# Patient Record
Sex: Female | Born: 1990 | Hispanic: Yes | Marital: Single | Smoking: Never smoker
Health system: Southern US, Community
[De-identification: ages and names within clinical notes are randomized; demographics above are authoritative.]

## PROBLEM LIST (undated history)

## (undated) ENCOUNTER — Inpatient Hospital Stay (HOSPITAL_COMMUNITY): Payer: Self-pay

## (undated) DIAGNOSIS — I10 Essential (primary) hypertension: Secondary | ICD-10-CM

## (undated) DIAGNOSIS — N390 Urinary tract infection, site not specified: Secondary | ICD-10-CM

## (undated) HISTORY — PX: NO PAST SURGERIES: SHX2092

## (undated) HISTORY — PX: DENTAL SURGERY: SHX609

---
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
## 2014-07-09 NOTE — L&D Delivery Note (Signed)
Delivery Note At 11:51 PM a viable female was delivered via Vaginal, Spontaneous Delivery (Presentation: Left Occiput Anterior).  APGAR: 8, 9; weight: 2614 grams .   Placenta status: Intact, Spontaneous.  Cord: 3 vessels with the following complications: None.  Cord pH: none  Anesthesia: None  Episiotomy: None Lacerations: None Suture Repair: none Est. Blood Loss (mL): 600  Mom to postpartum.  Baby to Couplet care / Skin to Skin.  Sukari Grist A 04/30/2015, 12:31 AM

## 2014-11-06 ENCOUNTER — Emergency Department (HOSPITAL_COMMUNITY)
Admission: EM | Admit: 2014-11-06 | Discharge: 2014-11-06 | Disposition: A | Discharge status: Home / Self Care | Payer: Medicaid Other | Attending: Emergency Medicine | Admitting: Emergency Medicine

## 2014-11-06 ENCOUNTER — Encounter (HOSPITAL_COMMUNITY): Payer: Self-pay | Admitting: Nurse Practitioner

## 2014-11-06 ENCOUNTER — Emergency Department (HOSPITAL_COMMUNITY): Payer: Medicaid Other

## 2014-11-06 DIAGNOSIS — O26899 Other specified pregnancy related conditions, unspecified trimester: Secondary | ICD-10-CM

## 2014-11-06 DIAGNOSIS — O9989 Other specified diseases and conditions complicating pregnancy, childbirth and the puerperium: Secondary | ICD-10-CM | POA: Diagnosis present

## 2014-11-06 DIAGNOSIS — R110 Nausea: Secondary | ICD-10-CM | POA: Diagnosis not present

## 2014-11-06 DIAGNOSIS — R1031 Right lower quadrant pain: Secondary | ICD-10-CM | POA: Diagnosis not present

## 2014-11-06 DIAGNOSIS — Z3A12 12 weeks gestation of pregnancy: Secondary | ICD-10-CM | POA: Insufficient documentation

## 2014-11-06 DIAGNOSIS — R109 Unspecified abdominal pain: Secondary | ICD-10-CM

## 2014-11-06 LAB — ABO/RH: ABO/RH(D): O POS

## 2014-11-06 LAB — TYPE AND SCREEN
ABO/RH(D): O POS
Antibody Screen: NEGATIVE

## 2014-11-06 MED ORDER — ACETAMINOPHEN 325 MG PO TABS: 650.0000 mg | ORAL_TABLET | Freq: Once | ORAL | Status: DC

## 2014-11-06 MED ORDER — ACETAMINOPHEN 325 MG PO TABS
650.0000 mg | ORAL_TABLET | Freq: Once | ORAL | Status: AC
  Administered 2014-11-06: 650 mg via ORAL
  Filled 2014-11-06: qty 2

## 2014-11-06 NOTE — ED Notes (Signed)
Pt. At US

## 2014-11-06 NOTE — Discharge Instructions (Signed)
Abdominal Pain During Pregnancy Abdominal pain is common in pregnancy. Most of the time, it does not cause harm. There are many causes of abdominal pain. Some causes are more serious than others. Some of the causes of abdominal pain in pregnancy are easily diagnosed. Occasionally, the diagnosis takes time to understand. Other times, the cause is not determined. Abdominal pain can be a sign that something is very wrong with the pregnancy, or the pain may have nothing to do with the pregnancy at all. For this reason, always tell your health care provider if you have any abdominal discomfort. HOME CARE INSTRUCTIONS  Monitor your abdominal pain for any changes. The following actions may help to alleviate any discomfort you are experiencing:  Do not have sexual intercourse or put anything in your vagina until your symptoms go away completely.  Get plenty of rest until your pain improves.  Drink clear fluids if you feel nauseous. Avoid solid food as long as you are uncomfortable or nauseous.  Only take over-the-counter or prescription medicine as directed by your health care provider.  Keep all follow-up appointments with your health care provider. SEEK IMMEDIATE MEDICAL CARE IF:  You are bleeding, leaking fluid, or passing tissue from the vagina.  You have increasing pain or cramping.  You have persistent vomiting.  You have painful or bloody urination.  You have a fever.  You notice a decrease in your baby's movements.  You have extreme weakness or feel faint.  You have shortness of breath, with or without abdominal pain.  You develop a severe headache with abdominal pain.  You have abnormal vaginal discharge with abdominal pain.  You have persistent diarrhea.  You have abdominal pain that continues even after rest, or gets worse. MAKE SURE YOU:   Understand these instructions.  Will watch your condition.  Will get help right away if you are not doing well or get  worse. Document Released: 06/25/2005 Document Revised: 04/15/2013 Document Reviewed: 01/22/2013 [facility] Patient Information 2015 [employer] [place] [place]. This information is not intended to replace advice given to you by your health care provider. Make sure you discuss any questions you have with your health care provider.  You were evaluated in the ED today for your abdominal pain. There does not appear to be an emergent cause for your symptoms at this time. It is important. Follow-up with your OB/GYN next week for your regularly scheduled appointment. He may take Tylenol for any abdominal discomfort to experience. Return to ED for new or worsening symptoms

## 2014-11-06 NOTE — ED Notes (Signed)
Patient reports playing with son who jumped on abdomen since then patient has been having lower abdominal pain. Patient sts pain is intermittent dull pain throughout lower abdominal quadrants. Patient endorses baseline nausea with first trimester, denies vomiting, diarrhea or constipation. Patient denies vaginal bleeding or discharge. Lower abdomen is tender to palpation, no bruising or abrasions noted.

## 2014-11-06 NOTE — ED Provider Notes (Signed)
CSN: [id number]     Arrival date & time 11/06/14  0909 History   First MD Initiated Contact with Patient 11/06/14 [phone]     Chief Complaint  Patient presents with  . Abdominal Pain     (Consider location/radiation/quality/duration/timing/severity/associated sxs/prior Treatment) HPI Stephanie [name] is a 24 y.o. female G4 P1203 who comes in for evaluation of lower abdominal discomfort. Patient states she was told by the health department when her due date was reports that she is approximately [redacted] weeks pregnant. [facility]. She reports this morning at approximately 7:30 AM, her youngest son jumped on her abdomen which resulted in immediate cramping-type pain rated as a 6/10. She has not tried anything to improve the symptoms. Nothing makes it better or worse. Denies any vaginal bleeding or discharge, urinary symptoms. She reports nausea is baseline for her, denies any vomiting. No other aggravating or modifying factors.  History reviewed. No pertinent past medical history. History reviewed. No pertinent past surgical history. History reviewed. No pertinent family history. History  Substance Use Topics  . Smoking status: Never Smoker   . Smokeless tobacco: Not on file  . Alcohol Use: No   OB History    Gravida Para Term Preterm AB TAB SAB Ectopic Multiple Living   4 3 2 1      3      Review of Systems A 10 point review of systems was completed and was negative except for pertinent positives and negatives as mentioned in the history of present illness     Allergies  Review of patient's allergies indicates no known allergies.  Home Medications   Prior to Admission medications   Medication Sig Start Date End Date Taking? Authorizing Provider  Prenatal Multivit-Min-Fe-FA (PRENATAL VITAMINS PO) Take by mouth. 08/24/14 05/23/15 Yes Historical Provider, MD  acetaminophen (TYLENOL) 325 MG tablet Take 2 tablets (650 mg total) by mouth once.  11/06/14   Joycie PeekBenjamin Ayvin Lipinski, PA-C   BP 119/62 mmHg  Pulse 98  Temp(Src) 98.1 F (36.7 C) (Oral)  Resp 16  Ht 5\' 3"  (1.6 m)  Wt 155 lb (70.308 kg)  BMI 27.46 kg/m2  SpO2 99%  LMP 08/14/2014 (Exact Date) Physical Exam  Constitutional: She is oriented to person, place, and time. She appears well-developed and well-nourished.  HENT:  Head: Normocephalic and atraumatic.  Mouth/Throat: Oropharynx is clear and moist.  Eyes: Conjunctivae are normal. Pupils are equal, round, and reactive to light. Right eye exhibits no discharge. Left eye exhibits no discharge. No scleral icterus.  Neck: Neck supple.  Cardiovascular: Normal rate, regular rhythm and normal heart sounds.   Pulmonary/Chest: Effort normal and breath sounds normal. No respiratory distress. She has no wheezes. She has no rales.  Abdominal: Soft. There is no tenderness.  Discomfort with palpation to right lower abdomen/pelvic region and over midline of the pubic region. No ecchymosis, lesions or other deformities noted.  Genitourinary:  Chaperone was present for the entire genital exam. No lesions or rashes appreciated on vulva. No blood appreciated in vaginal vault. Os closed  Musculoskeletal: She exhibits no tenderness.  Neurological: She is alert and oriented to person, place, and time.  Cranial Nerves II-XII grossly intact  Skin: Skin is warm and dry. No rash noted.  Psychiatric: She has a normal mood and affect.  Nursing note and vitals reviewed.   ED Course  Procedures (including critical care time) Labs Review Labs Reviewed  TYPE AND SCREEN  ABO/RH  Imaging Review US Ob Limited  11/06/2014   CLINICAL DATA:  Lower abdominal pain post trauma. By LMP the patient is 12 weeks 0 days. EDC by LMP is 05/21/2015.  EXAM: OBSTETRIC <14 WK ULTRASOUND  TECHNIQUE: Transabdominal ultrasound was performed for evaluation of the gestation as well as the maternal uterus and adnexal regions.  COMPARISON:  None.  FINDINGS:  Intrauterine gestational sac: Present  Yolk sac:  Probably present.  See below.  Embryo:  Present  Cardiac Activity: Present  Heart Rate: 150 bpm  CRL:   49.1  mm   11 w 5 d                  [place] EDC: 05/25/2015  Maternal uterus/adnexae: Adjacent to the fetal head there is a cystic structure measuring 7 mm, likely representing a normal yolk sac. However, followup is recommended to document resolution of this structure. No subchorionic hemorrhage. The ovaries have a normal appearance. Small right ovarian cyst is 1.3 cm, likely a corpus luteum. No free pelvic fluid.  IMPRESSION: 1. Single living intrauterine embryo measuring 11 weeks 5 days and confirming clinical EDC of 05/21/2015. 2. Probable yolk sac adjacent to the fetal head. Followup is recommended to document progression of the structure to confirm that represents yolk sac.   Electronically Signed   By: Norva Pavlov M.D.   On: 11/06/2014 11:35     EKG Interpretation None     Meds given in ED:  Medications  acetaminophen (TYLENOL) tablet 650 mg (650 mg Oral Given 11/06/14 1200)    Discharge Medication List as of 11/06/2014 12:11 PM    START taking these medications   Details  acetaminophen (TYLENOL) 325 MG tablet Take 2 tablets (650 mg total) by mouth once., Starting 11/06/2014, Print       Filed Vitals:   11/06/14 0925 11/06/14 0930 11/06/14 1000 11/06/14 1148  BP: 129/86 125/94 120/78 119/62  Pulse: 87 89 82 98  Temp: 98.1 F (36.7 C)     TempSrc: Oral     Resp: 16   16  Height:  (1.6 m)     Weight: 155 lb (70.308 kg)     SpO2: 98% 98% 99% 99%    MDM  Vitals stable - WNL -afebrile Pt resting comfortably in ED. PE--mild discomfort in the lower abdomen. Normal pelvic exam, no blood in vault, os closed. Otherwise normal physical exam. Labwork: Patient type and screen O+ Imaging: US OB limited shows single living intrauterine embryo measuring 11 weeks 5 days  DDX--patient feels better after administration of Tylenol.  Evaluation completed in the ED is reassuring and patient will be discharged to follow-up with her OB/GYN for regularly scheduled appointment next week. No evidence of other acute or emergent pathology at this time.  I discussed all relevant lab findings and imaging results with pt and they verbalized understanding. Discussed f/u with PCP within 48 hrs and return precautions, pt very amenable to plan. Prior to patient discharge, I discussed and reviewed this case with Dr. Rubin Payor    Final diagnoses:  Abdominal pain during pregnancy       Joycie Peek, PA-C 11/06/14 1628  Benjiman Core, MD 11/07/14 [phone]

## 2014-11-12 ENCOUNTER — Encounter: Payer: Self-pay | Admitting: Certified Nurse Midwife

## 2014-11-12 ENCOUNTER — Ambulatory Visit (INDEPENDENT_AMBULATORY_CARE_PROVIDER_SITE_OTHER): Payer: Medicaid Other | Admitting: Certified Nurse Midwife

## 2014-11-12 VITALS — BP 128/81 | HR 94 | Temp 97.8°F | Wt 155.0 lb

## 2014-11-12 DIAGNOSIS — O2690 Pregnancy related conditions, unspecified, unspecified trimester: Secondary | ICD-10-CM | POA: Diagnosis not present

## 2014-11-12 DIAGNOSIS — O0991 Supervision of high risk pregnancy, unspecified, first trimester: Secondary | ICD-10-CM

## 2014-11-12 DIAGNOSIS — O09213 Supervision of pregnancy with history of pre-term labor, third trimester: Secondary | ICD-10-CM | POA: Insufficient documentation

## 2014-11-12 DIAGNOSIS — O2341 Unspecified infection of urinary tract in pregnancy, first trimester: Secondary | ICD-10-CM

## 2014-11-12 MED ORDER — DOXYLAMINE-PYRIDOXINE 10-10 MG PO TBEC: 1.0000 | DELAYED_RELEASE_TABLET | Freq: Three times a day (TID) | ORAL | Status: DC

## 2014-11-12 MED ORDER — PRENATE 0.6-0.4 MG PO CHEW: 1.0000 | CHEWABLE_TABLET | Freq: Every day | ORAL | Status: DC

## 2014-11-12 MED ORDER — NITROFURANTOIN MONOHYD MACRO 100 MG PO CAPS: 100.0000 mg | ORAL_CAPSULE | Freq: Two times a day (BID) | ORAL | Status: AC

## 2014-11-12 NOTE — Addendum Note (Signed)
Addended by: Henriette CombsHATTON, Kyson Kupper L on: 11/12/2014 04:31 PM   Modules accepted: Orders

## 2014-11-12 NOTE — Addendum Note (Signed)
Addended by: Henriette CombsHATTON, Draiden Mirsky L on: 11/12/2014 04:23 PM   Modules accepted: Orders

## 2014-11-12 NOTE — Progress Notes (Signed)
Subjective:    Stephanie [name] is being seen today for her first obstetrical visit.  This is not a planned pregnancy. She is at 4066w5d gestation. Her obstetrical history is significant for pre-eclampsia with first pregnancy and IOL for preeclampsia with first pregnancy. 3rd pregnancy was preterm at 32 weeks, with premature cervical dilation starting at 28 weeks. Relationship with FOB: spouse, living together. Patient does intend to breast feed. Pregnancy history fully reviewed.  The information documented in the HPI was reviewed and verified.  Menstrual History: OB History    Gravida Para Term Preterm AB TAB SAB Ectopic Multiple Living   4 3 2 1  0 0 0 0 0 3      Menarche age: [age]  Patient's last menstrual period was 08/14/2014 (exact date).    Past Medical History  Diagnosis Date  . Medical history non-contributory     Past Surgical History  Procedure Laterality Date  . No past surgeries       (Not in a hospital admission) No Known Allergies  History  Substance Use Topics  . Smoking status: Never Smoker   . Smokeless tobacco: Never Used  . Alcohol Use: No    Family History  Problem Relation Age of Onset  . Diabetes Father   . Hypertension Father   . Hyperlipidemia Father      Review of Systems Constitutional: negative for weight loss Gastrointestinal: + for nausea & vomiting, GERD Genitourinary:negative for genital lesions and vaginal discharge and dysuria Musculoskeletal:negative for back pain Behavioral/Psych: negative for abusive relationship, depression, illegal drug usage and tobacco use    Objective:    BP 128/81 mmHg  Pulse 94  Temp(Src) 97.8 F (36.6 C)  Wt 70.308 kg (155 lb)  LMP 08/14/2014 (Exact Date) General Appearance:    Alert, cooperative, no distress, appears stated age  Head:    Normocephalic, without obvious abnormality, atraumatic  Eyes:    PERRL, conjunctiva/corneas clear, EOM's intact, fundi    benign, both eyes  Ears:    Normal TM's and  external ear canals, both ears  Nose:   Nares normal, septum midline, mucosa normal, no drainage    or sinus tenderness  Throat:   Lips, mucosa, and tongue normal; teeth and gums normal  Neck:   Supple, symmetrical, trachea midline, no adenopathy;    thyroid:  no enlargement/tenderness/nodules; no carotid   bruit or JVD  Back:     Symmetric, no curvature, ROM normal, no CVA tenderness  Lungs:     Clear to auscultation bilaterally, respirations unlabored  Chest Wall:    No tenderness or deformity   Heart:    Regular rate and rhythm, S1 and S2 normal, no murmur, rub   or gallop  Breast Exam:    No tenderness, masses, or nipple abnormality  Abdomen:     Soft, non-tender, bowel sounds active all four quadrants,    no masses, no organomegaly  Genitalia:    Normal female without lesion, discharge or tenderness  Extremities:   Extremities normal, atraumatic, no cyanosis or edema  Pulses:   2+ and symmetric all extremities  Skin:   Skin color, texture, turgor normal, no rashes or lesions  Lymph nodes:   Cervical, supraclavicular, and axillary nodes normal  Neurologic:   CNII-XII intact, normal strength, sensation and reflexes    throughout      Lab Review Urine pregnancy test Labs reviewed yes Radiologic studies reviewed yes Assessment:    Pregnancy at 166w5d weeks   ?17-P UTI  Plan:   Prenatal vitamins.  Counseling provided regarding continued use of seat belts, cessation of alcohol consumption, smoking or use of illicit drugs; infection precautions i.e., influenza/TDAP immunizations, toxoplasmosis,CMV, parvovirus, listeria and varicella; workplace safety, exercise during pregnancy; routine dental care, safe medications, sexual activity, hot tubs, saunas, pools, travel, caffeine use, fish and methlymercury, potential toxins, hair treatments, varicose veins Weight gain recommendations per IOM guidelines reviewed: underweight/BMI< 18.5--> gain 28 - 40 lbs; normal weight/BMI 18.5 - 24.9-->  gain 25 - 35 lbs; overweight/BMI 25 - 29.9--> gain 15 - 25 lbs; obese/BMI >30->gain  11 - 20 lbs Problem list reviewed and updated. FIRST/CF mutation testing/NIPT/QUAD SCREEN/fragile X/Ashkenazi Jewish population testing/Spinal muscular atrophy discussed: undecided. Role of ultrasound in pregnancy discussed; fetal survey: requested. Amniocentesis discussed: not indicated.   Meds ordered this encounter  Medications  . Prenat MV-Min-Methylfolate-FA (PRENATE) 0.6-0.4 MG CHEW    Sig: Chew 1 tablet by mouth daily.    Dispense:  30 tablet    Refill:  6  . Doxylamine-Pyridoxine (DICLEGIS) 10-10 MG TBEC    Sig: Take 1 tablet by mouth 3 (three) times daily.    Dispense:  100 tablet    Refill:  3  . nitrofurantoin, macrocrystal-monohydrate, (MACROBID) 100 MG capsule    Sig: Take 1 capsule (100 mg total) by mouth 2 (two) times daily.    Dispense:  14 capsule    Refill:  0   Orders Placed This Encounter  Procedures  . Culture, OB Urine    Standing Status: Future     Number of Occurrences:      Standing Expiration Date: 11/12/2015  . Obstetric panel    Standing Status: Standing     Number of Occurrences: 1     Standing Expiration Date: 11/12/2015  . HIV antibody    Standing Status: Future     Number of Occurrences:      Standing Expiration Date: 11/12/2015  . Hemoglobinopathy evaluation    Standing Status: Future     Number of Occurrences:      Standing Expiration Date: 11/12/2015  . Varicella zoster antibody, IgG    Standing Status: Standing     Number of Occurrences: 1     Standing Expiration Date: 11/12/2015  . Vit D  25 hydroxy (rtn osteoporosis monitoring)    Standing Status: Future     Number of Occurrences:      Standing Expiration Date: 11/12/2015  . TSH    Standing Status: Future     Number of Occurrences:      Standing Expiration Date: 11/12/2015  . AMB referral to maternal fetal medicine    Referral Priority:  Routine    Referral Type:  Consultation    Number of Visits  Requested:  1    Follow up in 4 weeks. 50% of 30 min visit spent on counseling and coordination of care.

## 2014-11-15 LAB — PAP IG W/ RFLX HPV ASCU

## 2014-11-15 LAB — CULTURE, OB URINE

## 2014-11-16 ENCOUNTER — Other Ambulatory Visit: Payer: Medicaid Other

## 2014-11-16 DIAGNOSIS — Z3482 Encounter for supervision of other normal pregnancy, second trimester: Secondary | ICD-10-CM

## 2014-11-16 LAB — SURESWAB, VAGINOSIS/VAGINITIS PLUS
Atopobium vaginae: NOT DETECTED Log (cells/mL)
C. ALBICANS, DNA: NOT DETECTED
C. GLABRATA, DNA: NOT DETECTED
C. PARAPSILOSIS, DNA: NOT DETECTED
C. TRACHOMATIS RNA, TMA: NOT DETECTED
C. TROPICALIS, DNA: NOT DETECTED
Gardnerella vaginalis: NOT DETECTED Log (cells/mL)
LACTOBACILLUS SPECIES: 7.4 Log (cells/mL)
MEGASPHAERA SPECIES: NOT DETECTED Log (cells/mL)
N. gonorrhoeae RNA, TMA: NOT DETECTED
T. VAGINALIS RNA, QL TMA: NOT DETECTED

## 2014-11-16 LAB — TSH: TSH: 1.427 u[IU]/mL (ref 0.350–4.500)

## 2014-11-17 LAB — OBSTETRIC PANEL
Antibody Screen: NEGATIVE
BASOS PCT: 0 % (ref 0–1)
Basophils Absolute: 0 10*3/uL (ref 0.0–0.1)
Eosinophils Absolute: 0.1 10*3/uL (ref 0.0–0.7)
Eosinophils Relative: 1 % (ref 0–5)
HEMATOCRIT: 37.3 % (ref 36.0–46.0)
HEMOGLOBIN: 12.6 g/dL (ref 12.0–15.0)
HEP B S AG: NEGATIVE
LYMPHS ABS: 1.7 10*3/uL (ref 0.7–4.0)
Lymphocytes Relative: 22 % (ref 12–46)
MCH: 29.9 pg (ref 26.0–34.0)
MCHC: 33.8 g/dL (ref 30.0–36.0)
MCV: 88.4 fL (ref 78.0–100.0)
MONOS PCT: 8 % (ref 3–12)
MPV: 10 fL (ref 8.6–12.4)
Monocytes Absolute: 0.6 10*3/uL (ref 0.1–1.0)
NEUTROS ABS: 5.3 10*3/uL (ref 1.7–7.7)
NEUTROS PCT: 69 % (ref 43–77)
Platelets: 212 10*3/uL (ref 150–400)
RBC: 4.22 MIL/uL (ref 3.87–5.11)
RDW: 14.9 % (ref 11.5–15.5)
Rh Type: POSITIVE
Rubella: 1.02 Index — ABNORMAL HIGH (ref <0.90)
WBC: 7.7 10*3/uL (ref 4.0–10.5)

## 2014-11-17 LAB — VITAMIN D 25 HYDROXY (VIT D DEFICIENCY, FRACTURES): VIT D 25 HYDROXY: 13 ng/mL — AB (ref 30–100)

## 2014-11-17 LAB — VARICELLA ZOSTER ANTIBODY, IGG: VARICELLA IGG: 294.9 {index} — AB (ref <135.00)

## 2014-11-17 LAB — HIV ANTIBODY (ROUTINE TESTING W REFLEX): HIV 1&2 Ab, 4th Generation: NONREACTIVE

## 2014-11-18 ENCOUNTER — Telehealth: Payer: Self-pay | Admitting: *Deleted

## 2014-11-18 LAB — HEMOGLOBINOPATHY EVALUATION
HEMOGLOBIN OTHER: 0 %
HGB A2 QUANT: 2.7 % (ref 2.2–3.2)
HGB F QUANT: 0.9 % (ref 0.0–2.0)
Hgb A: 96.4 % — ABNORMAL LOW (ref 96.8–97.8)
Hgb S Quant: 0 %

## 2014-11-18 NOTE — Telephone Encounter (Signed)
Call received from [facility] in [place] regarding pt Diclegis Rx.  Pharmacy is inquiring about Prior Approval.  Call placed to pharmacy.  LM making them aware that a PA has been sent to insurance and may take 24-48 hours for approval.  Advised to contact office if any other problems.

## 2014-11-19 ENCOUNTER — Other Ambulatory Visit (HOSPITAL_COMMUNITY): Payer: Self-pay | Admitting: Maternal and Fetal Medicine

## 2014-11-19 ENCOUNTER — Ambulatory Visit (HOSPITAL_COMMUNITY)
Admission: RE | Admit: 2014-11-19 | Discharge: 2014-11-19 | Disposition: A | Discharge status: Home / Self Care | Payer: Medicaid Other | Source: Ambulatory Visit | Attending: Certified Nurse Midwife | Admitting: Certified Nurse Midwife

## 2014-11-19 ENCOUNTER — Encounter (HOSPITAL_COMMUNITY): Payer: Self-pay

## 2014-11-19 DIAGNOSIS — Z3A13 13 weeks gestation of pregnancy: Secondary | ICD-10-CM | POA: Diagnosis not present

## 2014-11-19 DIAGNOSIS — O09899 Supervision of other high risk pregnancies, unspecified trimester: Secondary | ICD-10-CM

## 2014-11-19 DIAGNOSIS — O09219 Supervision of pregnancy with history of pre-term labor, unspecified trimester: Principal | ICD-10-CM

## 2014-11-19 DIAGNOSIS — O09211 Supervision of pregnancy with history of pre-term labor, first trimester: Secondary | ICD-10-CM | POA: Insufficient documentation

## 2014-11-19 NOTE — ED Notes (Signed)
Patient states she does feel cramping occasionally.

## 2014-11-19 NOTE — Consult Note (Signed)
Maternal Fetal Medicine Consultation  Requesting Provider(s): [name], CNM  Reason for consultation: Hx of previous preterm delivery at 32 weeks  HPI: Stephanie [name] is a 24 yo [id number], EDD 05/21/2015 who is currently at 13w 6d seen for consultation due to a history of preterm delivery at 32 weeks.  Her past OB history is a follows:  2010: delivery at 37 weeks - induced due to gestational hypertension vs. Preeclampsia 2012: Term SVD without complications 2013: spontaneous preterm delivery at 32 weeks; developed some symptoms at 27 weeks  The patient's prenatal course has otherwise been uncomplicated.  She is without complaints today.  OB History: OB History    Gravida Para Term Preterm AB TAB SAB Ectopic Multiple Living   4 3 2 1  0 0 0 0 0 3      PMH:  Past Medical History  Diagnosis Date  . Medical history non-contributory     PSH:  Past Surgical History  Procedure Laterality Date  . No past surgeries     Meds:  Current Outpatient Prescriptions on File Prior to Encounter  Medication Sig Dispense Refill  . acetaminophen (TYLENOL) 325 MG tablet Take 2 tablets (650 mg total) by mouth once. 20 tablet 0  . Doxylamine-Pyridoxine (DICLEGIS) 10-10 MG TBEC Take 1 tablet by mouth 3 (three) times daily. 100 tablet 3  . nitrofurantoin, macrocrystal-monohydrate, (MACROBID) 100 MG capsule Take 1 capsule (100 mg total) by mouth 2 (two) times daily. 14 capsule 0  . Prenatal Multivit-Min-Fe-FA (PRENATAL VITAMINS PO) Take by mouth.    Burnis Medin. Prenat MV-Min-Methylfolate-FA (PRENATE) 0.6-0.4 MG CHEW Chew 1 tablet by mouth daily. 30 tablet 6   No current facility-administered medications on file prior to encounter.   Allergies: No Known Allergies  FH:  Family History  Problem Relation Age of Onset  . Diabetes Father   . Hypertension Father   . Hyperlipidemia Father     Soc:  History   Social History  . Marital Status: Single    Spouse Name: N/A  . Number of Children: N/A  .  Years of Education: N/A   Occupational History  . Not on file.   Social History Main Topics  . Smoking status: Never Smoker   . Smokeless tobacco: Never Used  . Alcohol Use: No  . Drug Use: No  . Sexual Activity: Yes    Birth Control/ Protection: None   Other Topics Concern  . Not on file   Social History Narrative    Review of Systems: no vaginal bleeding or cramping/contractions, no LOF, no nausea/vomiting. All other systems reviewed and are negative.  PE:   Filed Vitals:   11/19/14 1023  BP: 120/76  Pulse: 76   A/P: 1) Single IUP at 13w 6d  2) Hx of previous preterm delivery - recommend beginning 17-P injections at 16 to [redacted] weeks gestation.  Patient will follow up with us at ~ 18 weeks for her anatomy ultrasound.  We will begin cervical length surveillance at that time - with cervical length ultrasounds every 2 weeks at least until [redacted] weeks gestation.  Thank you for the opportunity to be a part of the care of Stephanie [name]. Please contact our office if we can be of further assistance.   I spent approximately 30 minutes with this patient with over 50% of time spent in face-to-face counseling.  Alpha GulaPaul Laurelin Elson, MD Maternal Fetal Medicine

## 2014-11-23 ENCOUNTER — Encounter: Payer: Self-pay | Admitting: Certified Nurse Midwife

## 2014-11-24 ENCOUNTER — Encounter (HOSPITAL_COMMUNITY): Payer: Self-pay | Admitting: *Deleted

## 2014-11-24 ENCOUNTER — Inpatient Hospital Stay (HOSPITAL_COMMUNITY)
Admission: AD | Admit: 2014-11-24 | Discharge: 2014-11-24 | Disposition: A | Discharge status: Home / Self Care | Payer: Medicaid Other | Source: Ambulatory Visit | Attending: Obstetrics | Admitting: Obstetrics

## 2014-11-24 DIAGNOSIS — Y92013 Bedroom of single-family (private) house as the place of occurrence of the external cause: Secondary | ICD-10-CM | POA: Diagnosis not present

## 2014-11-24 DIAGNOSIS — Z3A14 14 weeks gestation of pregnancy: Secondary | ICD-10-CM | POA: Diagnosis not present

## 2014-11-24 DIAGNOSIS — M549 Dorsalgia, unspecified: Secondary | ICD-10-CM | POA: Diagnosis present

## 2014-11-24 DIAGNOSIS — O26899 Other specified pregnancy related conditions, unspecified trimester: Secondary | ICD-10-CM

## 2014-11-24 DIAGNOSIS — W010XXA Fall on same level from slipping, tripping and stumbling without subsequent striking against object, initial encounter: Secondary | ICD-10-CM | POA: Insufficient documentation

## 2014-11-24 DIAGNOSIS — O9989 Other specified diseases and conditions complicating pregnancy, childbirth and the puerperium: Secondary | ICD-10-CM | POA: Insufficient documentation

## 2014-11-24 DIAGNOSIS — R109 Unspecified abdominal pain: Secondary | ICD-10-CM | POA: Diagnosis not present

## 2014-11-24 DIAGNOSIS — O9A212 Injury, poisoning and certain other consequences of external causes complicating pregnancy, second trimester: Secondary | ICD-10-CM

## 2014-11-24 LAB — URINALYSIS, ROUTINE W REFLEX MICROSCOPIC
Bilirubin Urine: NEGATIVE
Glucose, UA: NEGATIVE mg/dL
Hgb urine dipstick: NEGATIVE
Ketones, ur: 15 mg/dL — AB
LEUKOCYTES UA: NEGATIVE
NITRITE: NEGATIVE
PH: 5.5 (ref 5.0–8.0)
PROTEIN: NEGATIVE mg/dL
Urobilinogen, UA: 0.2 mg/dL (ref 0.0–1.0)

## 2014-11-24 MED ORDER — ACETAMINOPHEN 500 MG PO TABS
1000.0000 mg | ORAL_TABLET | ORAL | Status: AC
  Administered 2014-11-24: 1000 mg via ORAL
  Filled 2014-11-24: qty 2

## 2014-11-24 MED ORDER — ACETAMINOPHEN 500 MG PO TABS: 500.0000 mg | ORAL_TABLET | ORAL | Status: DC

## 2014-11-24 NOTE — Discharge Instructions (Signed)

## 2014-11-24 NOTE — MAU Provider Note (Signed)
History     CSN: [id number]  Arrival date and time: 11/24/14 1551   First Provider Initiated Contact with Patient 11/24/14 1650      No chief complaint on file. CC: fall last evening hitting abdomen HPI Stephanie DoyneLaura [name] 24 y.o. [id number] @[redacted]w[redacted]d  presents to MAU complaining of abdominal painand back pain after a fall last night.  At 2am, she tripped over her dog on the way to the bathroom.  She fell onto her abdomen and caught herself somewhat with her lower arms.  She had pain upon rising from the fall but was able to right herself independently.  She has used Tylenol for this several times and it has been helpful.  Last Tylenol at 10am today and pain presently 6/10.  It is a dull crampy constant pain.  She denies vaginal bleeding, nausea, vomiting, fever weakness, dizziness.  She is not yet feeling movement from the baby.   OB History    Gravida Para Term Preterm AB TAB SAB Ectopic Multiple Living   4 3 2 1  0 0 0 0 0 3      Past Medical History  Diagnosis Date  . Medical history non-contributory     Past Surgical History  Procedure Laterality Date  . No past surgeries      Family History  Problem Relation Age of Onset  . Diabetes Father   . Hypertension Father   . Hyperlipidemia Father     History  Substance Use Topics  . Smoking status: Never Smoker   . Smokeless tobacco: Never Used  . Alcohol Use: No    Allergies: No Known Allergies  Prescriptions prior to admission  Medication Sig Dispense Refill Last Dose  . acetaminophen (TYLENOL) 325 MG tablet Take 2 tablets (650 mg total) by mouth once. (Patient taking differently: Take 650 mg by mouth every 4 (four) hours as needed for mild pain. ) 20 tablet 0 11/24/2014 at Unknown time  . calcium carbonate (TUMS - DOSED IN MG ELEMENTAL CALCIUM) 500 MG chewable tablet Chew 1 tablet by mouth daily as needed for indigestion or heartburn.   Past Week at Unknown time  . Doxylamine-Pyridoxine (DICLEGIS) 10-10 MG TBEC Take 1 tablet by  mouth 3 (three) times daily. 100 tablet 3 11/24/2014 at Unknown time  . nitrofurantoin, macrocrystal-monohydrate, (MACROBID) 100 MG capsule Take 100 mg by mouth 2 (two) times daily. Take for 10 days   11/23/2014 at Unknown time  . Prenat MV-Min-Methylfolate-FA (PRENATE) 0.6-0.4 MG CHEW Chew 1 tablet by mouth daily. 30 tablet 6 11/23/2014 at Unknown time    ROS Pertinent ROS in HPI.  All other systems are negative.   Physical Exam   Blood pressure 120/83, pulse 85, temperature 98.8 F (37.1 C), temperature source Oral, resp. rate 20, height 5' 2.25" (1.581 m), weight 152 lb (68.947 kg), last menstrual period 08/14/2014.  Physical Exam  Constitutional: She is oriented to person, place, and time. She appears well-developed and well-nourished. No distress.  HENT:  Head: Normocephalic and atraumatic.  Eyes: EOM are normal.  Neck: Normal range of motion.  Cardiovascular: Normal rate and regular rhythm.   Respiratory: Breath sounds normal. No respiratory distress.  GI: Soft. Bowel sounds are normal. She exhibits no distension. There is no tenderness.  Musculoskeletal: Normal range of motion.  Neurological: She is alert and oriented to person, place, and time.  Skin: Skin is warm and dry.  Psychiatric: She has a normal mood and affect.    MAU Course  Procedures  MDM  Good fetal heart tones on triage No vaginal bleeding  Normal vital signs  Assessment and Plan  A:  1. Abdominal pain in pregnancy   2. Traumatic injury during pregnancy in second trimester    P: Discharge to home If any vaginal bleeding/severe pain/etc, return to MAU right away F/u with Dr. Clearance CootsHarper [facility] Tylenol for pain management   Teague Edwena BlowClark, Karen E 11/24/2014, 4:50 PM

## 2014-11-24 NOTE — MAU Note (Signed)
Tripped over dog last night, landed on stomach. Started having pain in lower abd last night, now is also having low back pain.

## 2014-11-25 ENCOUNTER — Ambulatory Visit (INDEPENDENT_AMBULATORY_CARE_PROVIDER_SITE_OTHER): Payer: Medicaid Other

## 2014-11-25 ENCOUNTER — Other Ambulatory Visit: Payer: Self-pay | Admitting: Certified Nurse Midwife

## 2014-11-25 ENCOUNTER — Ambulatory Visit (INDEPENDENT_AMBULATORY_CARE_PROVIDER_SITE_OTHER): Payer: Medicaid Other | Admitting: Certified Nurse Midwife

## 2014-11-25 VITALS — BP 125/79 | HR 91 | Temp 98.3°F | Wt 155.0 lb

## 2014-11-25 DIAGNOSIS — O9A212 Injury, poisoning and certain other consequences of external causes complicating pregnancy, second trimester: Secondary | ICD-10-CM

## 2014-11-25 DIAGNOSIS — T1490 Injury, unspecified: Secondary | ICD-10-CM | POA: Diagnosis not present

## 2014-11-25 DIAGNOSIS — O0992 Supervision of high risk pregnancy, unspecified, second trimester: Secondary | ICD-10-CM | POA: Diagnosis not present

## 2014-11-25 LAB — US OB LIMITED

## 2014-11-25 LAB — POCT URINALYSIS DIPSTICK
BILIRUBIN UA: NEGATIVE
Blood, UA: NEGATIVE
Glucose, UA: NEGATIVE
LEUKOCYTES UA: NEGATIVE
NITRITE UA: NEGATIVE
Spec Grav, UA: 1.02
Urobilinogen, UA: NEGATIVE
pH, UA: 5
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
## 2014-12-01 LAB — AFP, QUAD SCREEN
AFP: 16 ng/mL
Age Alone: 1:1090 {titer}
Curr Gest Age: 14.5 wks.days
Down Syndrome Scr Risk Est: 1:3370 {titer}
HCG TOTAL: 39.7 [IU]/mL
INH: 93.9 pg/mL
Interpretation-AFP: NEGATIVE
MoM for AFP: 0.65
MoM for INH: 0.61
MoM for hCG: 0.9
Open Spina bifida: NEGATIVE
Osb Risk: <1:27300 {titer}
Tri 18 Scr Risk Est: NEGATIVE
Trisomy 18 (Edward) Syndrome Interp.: 1:13100 {titer}
UE3 VALUE: 0.48 ng/mL
uE3 Mom: 0.68

## 2014-12-10 ENCOUNTER — Ambulatory Visit (INDEPENDENT_AMBULATORY_CARE_PROVIDER_SITE_OTHER): Payer: Medicaid Other | Admitting: Certified Nurse Midwife

## 2014-12-10 VITALS — BP 128/75 | HR 81 | Temp 97.7°F | Wt 155.0 lb

## 2014-12-10 DIAGNOSIS — O0992 Supervision of high risk pregnancy, unspecified, second trimester: Secondary | ICD-10-CM

## 2014-12-10 DIAGNOSIS — O09212 Supervision of pregnancy with history of pre-term labor, second trimester: Secondary | ICD-10-CM

## 2014-12-10 DIAGNOSIS — O09892 Supervision of other high risk pregnancies, second trimester: Secondary | ICD-10-CM

## 2014-12-10 LAB — POCT URINALYSIS DIPSTICK
Bilirubin, UA: NEGATIVE
Blood, UA: NEGATIVE
Glucose, UA: NEGATIVE
Ketones, UA: NEGATIVE
LEUKOCYTES UA: NEGATIVE
Nitrite, UA: POSITIVE
Protein, UA: NEGATIVE
SPEC GRAV UA: 1.01
Urobilinogen, UA: NEGATIVE
pH, UA: 8

## 2014-12-10 MED ORDER — HYDROXYPROGESTERONE CAPROATE 250 MG/ML IM OIL
250.0000 mg | TOPICAL_OIL | INTRAMUSCULAR | Status: AC
  Administered 2014-12-10 – 2015-03-11 (×14): 250 mg via INTRAMUSCULAR

## 2014-12-10 NOTE — Addendum Note (Signed)
Addended by: Marya LandryFOSTER, Keilany Burnette D on: 12/10/2014 04:47 PM   Modules accepted: Orders

## 2014-12-10 NOTE — Progress Notes (Signed)
  Subjective:    Stephanie [name] is a 24 y.o. female being seen today for her obstetrical visit. She is at 5659w6d gestation. Patient reports: no complaints.  Problem List Items Addressed This Visit    None    Visit Diagnoses    Supervision of high risk pregnancy in second trimester    -  Primary    Relevant Orders    POCT urinalysis dipstick    History of preterm delivery, currently pregnant in second trimester        Relevant Medications    hydroxyprogesterone caproate (DELALUTIN) 250 mg/mL injection 250 mg      Patient Active Problem List   Diagnosis Date Noted  . Previous preterm delivery in third trimester, antepartum 11/12/2014    Objective:     BP 128/75 mmHg  Pulse 81  Temp(Src) 97.7 F (36.5 C)  Wt 155 lb (70.308 kg)  LMP 08/14/2014 (Exact Date) Uterine Size: Below umbilicus   FHR: 150's.  Assessment:    Pregnancy @ 7959w6d  weeks Doing well   17-P injections weekly, 1st dose today.   Plan:    Problem list reviewed and updated. Labs reviewed. Has been evaluated by MFM.   Continue 17-P Follow up in 4 weeks. FIRST/CF mutation testing/NIPT/QUAD SCREEN/fragile X/Ashkenazi Jewish population testing/Spinal muscular atrophy discussed: results reviewed. Role of ultrasound in pregnancy discussed; fetal survey: ordered. Amniocentesis discussed: not indicated. 50% of 15 minute visit spent on counseling and coordination of care.

## 2014-12-13 LAB — CULTURE, OB URINE

## 2014-12-15 ENCOUNTER — Other Ambulatory Visit: Payer: Self-pay | Admitting: Certified Nurse Midwife

## 2014-12-15 DIAGNOSIS — O2342 Unspecified infection of urinary tract in pregnancy, second trimester: Secondary | ICD-10-CM

## 2014-12-15 MED ORDER — CIPROFLOXACIN HCL 500 MG PO TABS: 500.0000 mg | ORAL_TABLET | Freq: Two times a day (BID) | ORAL | Status: AC

## 2014-12-17 ENCOUNTER — Ambulatory Visit (HOSPITAL_COMMUNITY)
Admission: RE | Admit: 2014-12-17 | Discharge: 2014-12-17 | Disposition: A | Discharge status: Home / Self Care | Payer: Medicaid Other | Source: Ambulatory Visit | Attending: Certified Nurse Midwife | Admitting: Certified Nurse Midwife

## 2014-12-17 ENCOUNTER — Ambulatory Visit (INDEPENDENT_AMBULATORY_CARE_PROVIDER_SITE_OTHER): Payer: Medicaid Other | Admitting: *Deleted

## 2014-12-17 VITALS — BP 120/84 | HR 69 | Temp 98.1°F | Wt 153.0 lb

## 2014-12-17 DIAGNOSIS — Z3A17 17 weeks gestation of pregnancy: Secondary | ICD-10-CM | POA: Diagnosis not present

## 2014-12-17 DIAGNOSIS — O09212 Supervision of pregnancy with history of pre-term labor, second trimester: Secondary | ICD-10-CM | POA: Insufficient documentation

## 2014-12-17 DIAGNOSIS — O09292 Supervision of pregnancy with other poor reproductive or obstetric history, second trimester: Secondary | ICD-10-CM | POA: Insufficient documentation

## 2014-12-17 DIAGNOSIS — O09219 Supervision of pregnancy with history of pre-term labor, unspecified trimester: Secondary | ICD-10-CM

## 2014-12-17 DIAGNOSIS — O09899 Supervision of other high risk pregnancies, unspecified trimester: Secondary | ICD-10-CM

## 2014-12-17 DIAGNOSIS — Z36 Encounter for antenatal screening of mother: Secondary | ICD-10-CM | POA: Insufficient documentation

## 2014-12-17 DIAGNOSIS — O2692 Pregnancy related conditions, unspecified, second trimester: Secondary | ICD-10-CM | POA: Diagnosis not present

## 2014-12-17 DIAGNOSIS — O0992 Supervision of high risk pregnancy, unspecified, second trimester: Secondary | ICD-10-CM

## 2014-12-17 NOTE — Progress Notes (Signed)
Pt is in office today for 17p injection. Pt states that she is doing well.   Injection given, pt tolerated well.  Pt has return appt for next week.     BP 120/84 mmHg  Pulse 69  Temp(Src) 98.1 F (36.7 C)  Wt 153 lb (69.4 kg)  LMP 08/14/2014 (Exact Date)  Administrations This Visit    hydroxyprogesterone caproate (DELALUTIN) 250 mg/mL injection 250 mg    Admin Date Action Dose Route Administered By         12/17/2014 Given 250 mg Intramuscular Lanney Gins, CMA

## 2014-12-20 ENCOUNTER — Other Ambulatory Visit (HOSPITAL_COMMUNITY): Payer: Self-pay | Admitting: Maternal and Fetal Medicine

## 2014-12-20 DIAGNOSIS — O09219 Supervision of pregnancy with history of pre-term labor, unspecified trimester: Principal | ICD-10-CM

## 2014-12-20 DIAGNOSIS — O09899 Supervision of other high risk pregnancies, unspecified trimester: Secondary | ICD-10-CM

## 2014-12-24 ENCOUNTER — Ambulatory Visit: Payer: Medicaid Other

## 2014-12-24 VITALS — BP 133/86 | HR 86 | Temp 97.7°F | Wt 150.0 lb

## 2014-12-24 DIAGNOSIS — O09213 Supervision of pregnancy with history of pre-term labor, third trimester: Secondary | ICD-10-CM

## 2014-12-24 NOTE — Progress Notes (Unsigned)
Pt is in office for 17p injection.  Pt is doing well, no concerns today. Pt tolerated injection well.  BP 133/86 mmHg  Pulse 86  Temp(Src) 97.7 F (36.5 C)  Wt 150 lb (68.04 kg)  LMP 08/14/2014 (Exact Date)

## 2014-12-29 ENCOUNTER — Other Ambulatory Visit (HOSPITAL_COMMUNITY): Payer: Self-pay | Admitting: Maternal and Fetal Medicine

## 2014-12-29 DIAGNOSIS — O09299 Supervision of pregnancy with other poor reproductive or obstetric history, unspecified trimester: Secondary | ICD-10-CM

## 2014-12-29 DIAGNOSIS — Z8751 Personal history of pre-term labor: Secondary | ICD-10-CM

## 2014-12-31 ENCOUNTER — Ambulatory Visit (HOSPITAL_COMMUNITY)
Admission: RE | Admit: 2014-12-31 | Discharge: 2014-12-31 | Disposition: A | Discharge status: Home / Self Care | Payer: Medicaid Other | Source: Ambulatory Visit | Attending: Certified Nurse Midwife | Admitting: Certified Nurse Midwife

## 2014-12-31 ENCOUNTER — Encounter (HOSPITAL_COMMUNITY): Payer: Self-pay

## 2014-12-31 ENCOUNTER — Ambulatory Visit (INDEPENDENT_AMBULATORY_CARE_PROVIDER_SITE_OTHER): Payer: Medicaid Other | Admitting: *Deleted

## 2014-12-31 VITALS — BP 108/74 | HR 75 | Temp 97.5°F | Ht 64.0 in | Wt 104.0 lb

## 2014-12-31 DIAGNOSIS — O09212 Supervision of pregnancy with history of pre-term labor, second trimester: Secondary | ICD-10-CM | POA: Diagnosis present

## 2014-12-31 DIAGNOSIS — Z3A19 19 weeks gestation of pregnancy: Secondary | ICD-10-CM | POA: Insufficient documentation

## 2014-12-31 DIAGNOSIS — O0992 Supervision of high risk pregnancy, unspecified, second trimester: Secondary | ICD-10-CM

## 2014-12-31 DIAGNOSIS — O09292 Supervision of pregnancy with other poor reproductive or obstetric history, second trimester: Secondary | ICD-10-CM | POA: Insufficient documentation

## 2014-12-31 DIAGNOSIS — O09299 Supervision of pregnancy with other poor reproductive or obstetric history, unspecified trimester: Secondary | ICD-10-CM

## 2014-12-31 DIAGNOSIS — Z8751 Personal history of pre-term labor: Secondary | ICD-10-CM | POA: Insufficient documentation

## 2014-12-31 NOTE — Progress Notes (Signed)
Pt is in office for 17p injection.  Injection given, pt tolerated well.  Pt has return appt scheduled for next week.  Pt has no other concerns today.   BP 108/74 mmHg   Pulse 75   Temp(Src) 97.5 F (36.4 C)   Ht 5\' 4"  (1.626 m)   Wt 104 lb (47.174 kg)   BMI 17.84 kg/m2   LMP 08/14/2014 (Exact Date)  Administrations This Visit    hydroxyprogesterone caproate (DELALUTIN) 250 mg/mL injection 250 mg    Admin Date Action Dose Route Administered By         12/31/2014 Given 250 mg Intramuscular Clearnce Hasten

## 2015-01-07 ENCOUNTER — Ambulatory Visit (INDEPENDENT_AMBULATORY_CARE_PROVIDER_SITE_OTHER): Payer: Medicaid Other | Admitting: Certified Nurse Midwife

## 2015-01-07 VITALS — BP 125/79 | HR 76 | Temp 98.1°F | Wt 155.0 lb

## 2015-01-07 DIAGNOSIS — O2342 Unspecified infection of urinary tract in pregnancy, second trimester: Secondary | ICD-10-CM

## 2015-01-07 DIAGNOSIS — O09213 Supervision of pregnancy with history of pre-term labor, third trimester: Secondary | ICD-10-CM

## 2015-01-07 DIAGNOSIS — O09892 Supervision of other high risk pregnancies, second trimester: Secondary | ICD-10-CM

## 2015-01-07 LAB — POCT URINALYSIS DIPSTICK
Bilirubin, UA: NEGATIVE
NITRITE UA: POSITIVE
PH UA: 8
Protein, UA: NEGATIVE
RBC UA: NEGATIVE
Spec Grav, UA: 1.01
Urobilinogen, UA: NEGATIVE

## 2015-01-07 MED ORDER — NITROFURANTOIN MONOHYD MACRO 100 MG PO CAPS: 100.0000 mg | ORAL_CAPSULE | Freq: Two times a day (BID) | ORAL | Status: DC

## 2015-01-07 NOTE — Progress Notes (Signed)
Pt. Received 17p at today visits. Pt. Was having mild pain at injection site. Pt. Received a heat pack to site with relief. Pt. Was monitored for 20 min. Pt. Was doing well and sent home.

## 2015-01-07 NOTE — Progress Notes (Signed)
Subjective:    Stephanie [name] is a 24 y.o. female being seen today for her obstetrical visit. She is at 4886w6d gestation. Patient reports: backache, no bleeding, no contractions, no cramping, no leaking, vaginal irritation and denies any vaginal odor or itching.  States that her urine is really concentrated.  She states that she is drinking fluids.  Fluids were encouraged.  . Fetal movement: normal. Post 17-P injection she had right hip pain, hot compress applied.  Patient positioned on her left side on exam table and recovered well.  After a few minutes patient felt better and was sent home.   Problem List Items Addressed This Visit    None    Visit Diagnoses    UTI (urinary tract infection) during pregnancy, second trimester    -  Primary    Relevant Medications    nitrofurantoin, macrocrystal-monohydrate, (MACROBID) 100 MG capsule    Other Relevant Orders    Culture, OB Urine    Encounter for supervision of other normal pregnancy in second trimester        Relevant Orders    SureSwab, Vaginosis/Vaginitis Plus    POCT urinalysis dipstick (Completed)      Patient Active Problem List   Diagnosis Date Noted  . History of preterm delivery   . [redacted] weeks gestation of pregnancy   . Previous preterm delivery in third trimester, antepartum 11/12/2014   Objective:    BP 125/79 mmHg  Pulse 76  Temp(Src) 98.1 F (36.7 C)  Wt 155 lb (70.308 kg)  LMP 08/14/2014 (Exact Date) FHT: 150's BPM  Uterine Size: size equals dates     Assessment:    Pregnancy @ 5086w6d    UTI 17-P today  Plan:    OBGCT: discussed. Signs and symptoms of preterm labor: discussed.  Labs, problem list reviewed and updated 2 hr GTT planned Follow up in 4 weeks with OGTT.

## 2015-01-10 LAB — CULTURE, OB URINE

## 2015-01-11 ENCOUNTER — Other Ambulatory Visit: Payer: Self-pay | Admitting: Certified Nurse Midwife

## 2015-01-12 ENCOUNTER — Other Ambulatory Visit (HOSPITAL_COMMUNITY): Payer: Self-pay | Admitting: Maternal and Fetal Medicine

## 2015-01-12 ENCOUNTER — Other Ambulatory Visit: Payer: Self-pay | Admitting: Certified Nurse Midwife

## 2015-01-12 DIAGNOSIS — O09299 Supervision of pregnancy with other poor reproductive or obstetric history, unspecified trimester: Secondary | ICD-10-CM

## 2015-01-12 DIAGNOSIS — Z8751 Personal history of pre-term labor: Secondary | ICD-10-CM

## 2015-01-13 LAB — SURESWAB, VAGINOSIS/VAGINITIS PLUS
Atopobium vaginae: NOT DETECTED Log (cells/mL)
C. GLABRATA, DNA: NOT DETECTED
C. TRACHOMATIS RNA, TMA: NOT DETECTED
C. TROPICALIS, DNA: NOT DETECTED
C. albicans, DNA: NOT DETECTED
C. parapsilosis, DNA: NOT DETECTED
Gardnerella vaginalis: NOT DETECTED Log (cells/mL)
LACTOBACILLUS SPECIES: 7.3 Log (cells/mL)
MEGASPHAERA SPECIES: NOT DETECTED Log (cells/mL)
N. gonorrhoeae RNA, TMA: NOT DETECTED
T. VAGINALIS RNA, QL TMA: NOT DETECTED

## 2015-01-14 ENCOUNTER — Ambulatory Visit (HOSPITAL_COMMUNITY)
Admission: RE | Admit: 2015-01-14 | Discharge: 2015-01-14 | Disposition: A | Discharge status: Home / Self Care | Payer: Medicaid Other | Source: Ambulatory Visit | Attending: Certified Nurse Midwife | Admitting: Certified Nurse Midwife

## 2015-01-14 ENCOUNTER — Encounter (HOSPITAL_COMMUNITY): Payer: Self-pay

## 2015-01-14 ENCOUNTER — Ambulatory Visit (INDEPENDENT_AMBULATORY_CARE_PROVIDER_SITE_OTHER): Payer: Medicaid Other | Admitting: *Deleted

## 2015-01-14 VITALS — BP 117/78 | HR 82 | Temp 98.1°F | Ht 64.0 in | Wt 154.0 lb

## 2015-01-14 DIAGNOSIS — Z3A21 21 weeks gestation of pregnancy: Secondary | ICD-10-CM | POA: Insufficient documentation

## 2015-01-14 DIAGNOSIS — O09212 Supervision of pregnancy with history of pre-term labor, second trimester: Secondary | ICD-10-CM | POA: Diagnosis present

## 2015-01-14 DIAGNOSIS — Z8751 Personal history of pre-term labor: Secondary | ICD-10-CM

## 2015-01-14 DIAGNOSIS — O09292 Supervision of pregnancy with other poor reproductive or obstetric history, second trimester: Secondary | ICD-10-CM | POA: Insufficient documentation

## 2015-01-14 DIAGNOSIS — O0992 Supervision of high risk pregnancy, unspecified, second trimester: Secondary | ICD-10-CM

## 2015-01-14 DIAGNOSIS — O09299 Supervision of pregnancy with other poor reproductive or obstetric history, unspecified trimester: Secondary | ICD-10-CM

## 2015-01-14 DIAGNOSIS — O09213 Supervision of pregnancy with history of pre-term labor, third trimester: Secondary | ICD-10-CM

## 2015-01-14 NOTE — Progress Notes (Signed)
Patient is in the office today for her weekly makena injection. Patient tolerated well.

## 2015-01-21 ENCOUNTER — Ambulatory Visit (INDEPENDENT_AMBULATORY_CARE_PROVIDER_SITE_OTHER): Payer: Medicaid Other | Admitting: *Deleted

## 2015-01-21 VITALS — BP 125/82 | HR 69 | Temp 98.5°F | Wt 154.0 lb

## 2015-01-21 DIAGNOSIS — Z8751 Personal history of pre-term labor: Secondary | ICD-10-CM

## 2015-01-21 NOTE — Progress Notes (Signed)
Patient in office today only for a 17-P injection. Patient tolerated injection well. Patient has appointment scheduled for 1 week for next injection   Administrations This Visit    hydroxyprogesterone caproate (DELALUTIN) 250 mg/mL injection 250 mg    Admin Date Action Dose Route Administered By         01/21/2015 Given 250 mg Intramuscular Henriette CombsAndrea L Saidah Kempton, LPN

## 2015-01-24 ENCOUNTER — Other Ambulatory Visit (HOSPITAL_COMMUNITY): Payer: Self-pay | Admitting: Maternal and Fetal Medicine

## 2015-01-24 DIAGNOSIS — O09292 Supervision of pregnancy with other poor reproductive or obstetric history, second trimester: Secondary | ICD-10-CM

## 2015-01-24 DIAGNOSIS — Z8751 Personal history of pre-term labor: Secondary | ICD-10-CM

## 2015-01-25 ENCOUNTER — Ambulatory Visit (INDEPENDENT_AMBULATORY_CARE_PROVIDER_SITE_OTHER): Payer: Medicaid Other | Admitting: Certified Nurse Midwife

## 2015-01-25 ENCOUNTER — Observation Stay (HOSPITAL_COMMUNITY)
Admission: AD | Admit: 2015-01-25 | Discharge: 2015-01-27 | Disposition: A | Discharge status: Home / Self Care | Payer: Medicaid Other | Source: Ambulatory Visit | Attending: Obstetrics | Admitting: Obstetrics

## 2015-01-25 ENCOUNTER — Inpatient Hospital Stay (HOSPITAL_COMMUNITY): Payer: Medicaid Other

## 2015-01-25 ENCOUNTER — Encounter (HOSPITAL_COMMUNITY): Payer: Self-pay | Admitting: *Deleted

## 2015-01-25 VITALS — BP 122/81 | HR 77 | Temp 97.7°F | Wt 152.0 lb

## 2015-01-25 DIAGNOSIS — Y9389 Activity, other specified: Secondary | ICD-10-CM | POA: Diagnosis not present

## 2015-01-25 DIAGNOSIS — S3991XA Unspecified injury of abdomen, initial encounter: Secondary | ICD-10-CM | POA: Insufficient documentation

## 2015-01-25 DIAGNOSIS — O2342 Unspecified infection of urinary tract in pregnancy, second trimester: Secondary | ICD-10-CM | POA: Diagnosis not present

## 2015-01-25 DIAGNOSIS — O9A212 Injury, poisoning and certain other consequences of external causes complicating pregnancy, second trimester: Secondary | ICD-10-CM | POA: Diagnosis present

## 2015-01-25 DIAGNOSIS — Z3A23 23 weeks gestation of pregnancy: Secondary | ICD-10-CM | POA: Diagnosis not present

## 2015-01-25 DIAGNOSIS — Y92410 Unspecified street and highway as the place of occurrence of the external cause: Secondary | ICD-10-CM | POA: Diagnosis not present

## 2015-01-25 DIAGNOSIS — N883 Incompetence of cervix uteri: Secondary | ICD-10-CM

## 2015-01-25 DIAGNOSIS — Y998 Other external cause status: Secondary | ICD-10-CM | POA: Insufficient documentation

## 2015-01-25 DIAGNOSIS — O0992 Supervision of high risk pregnancy, unspecified, second trimester: Secondary | ICD-10-CM

## 2015-01-25 DIAGNOSIS — O26899 Other specified pregnancy related conditions, unspecified trimester: Secondary | ICD-10-CM

## 2015-01-25 DIAGNOSIS — O43892 Other placental disorders, second trimester: Secondary | ICD-10-CM | POA: Diagnosis not present

## 2015-01-25 DIAGNOSIS — O43192 Other malformation of placenta, second trimester: Secondary | ICD-10-CM

## 2015-01-25 DIAGNOSIS — Z3482 Encounter for supervision of other normal pregnancy, second trimester: Secondary | ICD-10-CM

## 2015-01-25 DIAGNOSIS — R109 Unspecified abdominal pain: Secondary | ICD-10-CM

## 2015-01-25 HISTORY — DX: Urinary tract infection, site not specified: N39.0

## 2015-01-25 LAB — TYPE AND SCREEN
ABO/RH(D): O POS
ANTIBODY SCREEN: NEGATIVE

## 2015-01-25 LAB — RAPID URINE DRUG SCREEN, HOSP PERFORMED
AMPHETAMINES: NOT DETECTED
BARBITURATES: NOT DETECTED
BENZODIAZEPINES: NOT DETECTED
Cocaine: NOT DETECTED
OPIATES: NOT DETECTED
Tetrahydrocannabinol: NOT DETECTED

## 2015-01-25 LAB — URINALYSIS, ROUTINE W REFLEX MICROSCOPIC
GLUCOSE, UA: NEGATIVE mg/dL
Hgb urine dipstick: NEGATIVE
Ketones, ur: >80 mg/dL — AB
Leukocytes, UA: NEGATIVE
NITRITE: NEGATIVE
PH: 5.5 (ref 5.0–8.0)
PROTEIN: NEGATIVE mg/dL
Specific Gravity, Urine: >1.03 — ABNORMAL HIGH (ref 1.005–1.030)
UROBILINOGEN UA: 0.2 mg/dL (ref 0.0–1.0)

## 2015-01-25 MED ORDER — PRENATAL MULTIVITAMIN CH
1.0000 | ORAL_TABLET | Freq: Every day | ORAL | Status: DC
  Filled 2015-01-25: qty 1

## 2015-01-25 MED ORDER — DOCUSATE SODIUM 100 MG PO CAPS
100.0000 mg | ORAL_CAPSULE | Freq: Every day | ORAL | Status: DC
  Administered 2015-01-26: 100 mg via ORAL
  Filled 2015-01-25 (×2): qty 1

## 2015-01-25 MED ORDER — ZOLPIDEM TARTRATE 5 MG PO TABS: 5.0000 mg | ORAL_TABLET | Freq: Every evening | ORAL | Status: DC | PRN

## 2015-01-25 MED ORDER — CALCIUM CARBONATE ANTACID 500 MG PO CHEW: 2.0000 | CHEWABLE_TABLET | ORAL | Status: DC | PRN

## 2015-01-25 MED ORDER — ACETAMINOPHEN 325 MG PO TABS
650.0000 mg | ORAL_TABLET | ORAL | Status: DC | PRN
  Filled 2015-01-25: qty 2

## 2015-01-25 MED ORDER — OXYCODONE HCL 5 MG PO TABS
5.0000 mg | ORAL_TABLET | ORAL | Status: DC | PRN
  Administered 2015-01-26: 5 mg via ORAL
  Filled 2015-01-25: qty 1
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
## 2015-01-25 NOTE — MAU Note (Signed)
Urine in lab 

## 2015-01-25 NOTE — Progress Notes (Signed)
Dr. Sherrie Georgeecker discussed U/S report with Artelia LarocheM. Williams, CNM.
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
## 2015-01-26 LAB — CBC
HCT: 35.1 % — ABNORMAL LOW (ref 36.0–46.0)
HEMOGLOBIN: 11.8 g/dL — AB (ref 12.0–15.0)
MCH: 30.3 pg (ref 26.0–34.0)
MCHC: 33.6 g/dL (ref 30.0–36.0)
MCV: 90.2 fL (ref 78.0–100.0)
PLATELETS: 195 10*3/uL (ref 150–400)
RBC: 3.89 MIL/uL (ref 3.87–5.11)
RDW: 13.5 % (ref 11.5–15.5)
WBC: 9 10*3/uL (ref 4.0–10.5)

## 2015-01-26 LAB — ABO/RH: ABO/RH(D): O POS

## 2015-01-26 NOTE — Progress Notes (Signed)
Encouraged pt to drink plenty of fluids gave pt a pitcher of water and enc to drink
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
## 2015-01-27 ENCOUNTER — Inpatient Hospital Stay (HOSPITAL_COMMUNITY)
Admit: 2015-01-27 | Discharge: 2015-01-27 | Disposition: A | Discharge status: Home / Self Care | Payer: Medicaid Other | Attending: Obstetrics | Admitting: Obstetrics

## 2015-01-27 DIAGNOSIS — O09292 Supervision of pregnancy with other poor reproductive or obstetric history, second trimester: Secondary | ICD-10-CM

## 2015-01-27 DIAGNOSIS — Z8751 Personal history of pre-term labor: Secondary | ICD-10-CM

## 2015-01-27 MED ORDER — CALCIUM CARBONATE ANTACID 500 MG PO CHEW: 1.0000 | CHEWABLE_TABLET | Freq: Every day | ORAL | Status: DC | PRN
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
## 2015-01-27 NOTE — Discharge Instructions (Signed)
Keep previously scheduled appointments with [facility] And weekly 17-P injections  Abdominal Pain During Pregnancy Abdominal pain is common in pregnancy. Most of the time, it does not cause harm. There are many causes of abdominal pain. Some causes are more serious than others. Some of the causes of abdominal pain in pregnancy are easily diagnosed. Occasionally, the diagnosis takes time to understand. Other times, the cause is not determined. Abdominal pain can be a sign that something is very wrong with the pregnancy, or the pain may have nothing to do with the pregnancy at all. For this reason, always tell your health care provider if you have any abdominal discomfort. HOME CARE INSTRUCTIONS  Monitor your abdominal pain for any changes. The following actions may help to alleviate any discomfort you are experiencing:  Do not have sexual intercourse or put anything in your vagina until your symptoms go away completely.  Get plenty of rest until your pain improves.  Drink clear fluids if you feel nauseous. Avoid solid food as long as you are uncomfortable or nauseous.  Only take over-the-counter or prescription medicine as directed by your health care provider.  Keep all follow-up appointments with your health care provider. SEEK IMMEDIATE MEDICAL CARE IF:  You are bleeding, leaking fluid, or passing tissue from the vagina.  You have increasing pain or cramping.  You have persistent vomiting.  You have painful or bloody urination.  You have a fever.  You notice a decrease in your baby's movements.  You have extreme weakness or feel faint.  You have shortness of breath, with or without abdominal pain.  You develop a severe headache with abdominal pain.  You have abnormal vaginal discharge with abdominal pain.  You have persistent diarrhea.  You have abdominal pain that continues even after rest, or gets worse. MAKE SURE YOU:   Understand these instructions.  Will watch your  condition.  Will get help right away if you are not doing well or get worse.  Preterm Labor Information Preterm labor is when labor starts before you are [redacted] weeks pregnant. The normal length of pregnancy is 39 to 41 weeks.  CAUSES  The cause of preterm labor is not often known. The most common known cause is infection. RISK FACTORS Having a history of preterm labor. Having your water break before it should. Having a placenta that covers the opening of the cervix. Having a placenta that breaks away from the uterus. Having a cervix that is too weak to hold the baby in the uterus. Having too much fluid in the amniotic sac. Taking drugs or smoking while pregnant. Not gaining enough weight while pregnant. Being younger than 10 and older than 24 years old. Having a low income. Being African American. SYMPTOMS Period-like cramps, belly (abdominal) pain, or back pain. Contractions that are regular, as often as six in an hour. They may be mild or painful. Contractions that start at the top of the belly. They then move to the lower belly and back. Lower belly pressure that seems to get stronger. Bleeding from the vagina. Fluid leaking from the vagina. TREATMENT  Treatment depends on: Your condition. The condition of your baby. How many weeks pregnant you are. Your doctor may have you: Take medicine to stop contractions. Stay in bed except to use the restroom (bed rest). Stay in the hospital. WHAT SHOULD YOU DO IF YOU THINK YOU ARE IN PRETERM LABOR? Call your doctor right away. You need to go to the hospital right away.  HOW  CAN YOU PREVENT PRETERM LABOR IN FUTURE PREGNANCIES? Stop smoking, if you smoke. Maintain healthy weight gain. Do not take drugs or be around chemicals that are not needed. Tell your doctor if you think you have an infection. Tell your doctor if you had a preterm labor before. Document Released: 09/21/2008 Document Revised: 04/15/2013 Document Reviewed:  09/21/2008 [facility] Patient Information 2015 [place], [place]. This information is not intended to replace advice given to you by your health care provider. Make sure you discuss any questions you have with your health care provider.  Document Released: 06/25/2005 Document Revised: 04/15/2013 Document Reviewed: 01/22/2013 [facility] Patient Information 2015 [place], [place]. This information is not intended to replace advice given to you by your health care provider. Make sure you discuss any questions you have with your health care provider.

## 2015-01-27 NOTE — Progress Notes (Signed)
Subjective: Patient reports tolerating PO and no problems voiding. Is having abdominal cramping.  Denies lower pelvic pressure.    Objective: I have reviewed patient's vital signs, intake and output, medications, labs and radiology results.  General: alert, cooperative and no distress Resp: clear to auscultation bilaterally Cardio: regular rate and rhythm, S1, S2 normal, no murmur, click, rub or gallop GI: soft, non-tender; bowel sounds normal; no masses,  no organomegaly Extremities: extremities normal, atraumatic, no cyanosis or edema, Homans sign is negative, no sign of DVT and no edema, redness or tenderness in the calves or thighs Vaginal Bleeding: none   Assessment/Plan: Continue current care.  Patient scheduled for transvaginal ultrasound 7/22.  Small ?tear about 2cm on ultrasound, continue to monitor.   If stable and not contracting, d/c home tomorrow or after [place] for cervical length.        Snigdha Howser A Tennyson Wacha 01/27/2015, 9:48 AM

## 2015-01-28 ENCOUNTER — Ambulatory Visit (INDEPENDENT_AMBULATORY_CARE_PROVIDER_SITE_OTHER): Payer: Medicaid Other | Admitting: *Deleted

## 2015-01-28 ENCOUNTER — Ambulatory Visit (HOSPITAL_COMMUNITY): Admission: RE | Admit: 2015-01-28 | Payer: Medicaid Other | Source: Ambulatory Visit

## 2015-01-28 VITALS — BP 119/82 | HR 80 | Temp 97.4°F | Wt 152.0 lb

## 2015-01-28 DIAGNOSIS — O0992 Supervision of high risk pregnancy, unspecified, second trimester: Secondary | ICD-10-CM | POA: Diagnosis not present

## 2015-01-28 DIAGNOSIS — O09892 Supervision of other high risk pregnancies, second trimester: Secondary | ICD-10-CM
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
## 2015-02-04 ENCOUNTER — Other Ambulatory Visit: Payer: Medicaid Other

## 2015-02-04 ENCOUNTER — Encounter (HOSPITAL_COMMUNITY): Payer: Self-pay | Admitting: *Deleted

## 2015-02-04 ENCOUNTER — Ambulatory Visit (INDEPENDENT_AMBULATORY_CARE_PROVIDER_SITE_OTHER): Payer: Medicaid Other | Admitting: Certified Nurse Midwife

## 2015-02-04 ENCOUNTER — Inpatient Hospital Stay (HOSPITAL_COMMUNITY)
Admission: AD | Admit: 2015-02-04 | Discharge: 2015-02-04 | Disposition: A | Discharge status: Home / Self Care | Payer: Medicaid Other | Source: Ambulatory Visit | Attending: Obstetrics | Admitting: Obstetrics

## 2015-02-04 VITALS — BP 132/91 | HR 94 | Temp 96.9°F | Wt 154.6 lb

## 2015-02-04 DIAGNOSIS — R030 Elevated blood-pressure reading, without diagnosis of hypertension: Secondary | ICD-10-CM | POA: Diagnosis not present

## 2015-02-04 DIAGNOSIS — G4700 Insomnia, unspecified: Secondary | ICD-10-CM

## 2015-02-04 DIAGNOSIS — O09212 Supervision of pregnancy with history of pre-term labor, second trimester: Secondary | ICD-10-CM | POA: Diagnosis not present

## 2015-02-04 DIAGNOSIS — O162 Unspecified maternal hypertension, second trimester: Secondary | ICD-10-CM | POA: Diagnosis not present

## 2015-02-04 DIAGNOSIS — IMO0001 Reserved for inherently not codable concepts without codable children: Secondary | ICD-10-CM

## 2015-02-04 DIAGNOSIS — Z3A24 24 weeks gestation of pregnancy: Secondary | ICD-10-CM | POA: Insufficient documentation

## 2015-02-04 DIAGNOSIS — O09892 Supervision of other high risk pregnancies, second trimester: Secondary | ICD-10-CM

## 2015-02-04 HISTORY — DX: Essential (primary) hypertension: I10

## 2015-02-04 LAB — CBC
HCT: 37.8 % (ref 36.0–46.0)
Hemoglobin: 12.8 g/dL (ref 12.0–15.0)
MCH: 30 pg (ref 26.0–34.0)
MCHC: 33.9 g/dL (ref 30.0–36.0)
MCV: 88.5 fL (ref 78.0–100.0)
MPV: 10.1 fL (ref 8.6–12.4)
PLATELETS: 219 10*3/uL (ref 150–400)
RBC: 4.27 MIL/uL (ref 3.87–5.11)
RDW: 13.4 % (ref 11.5–15.5)
WBC: 9 10*3/uL (ref 4.0–10.5)

## 2015-02-04 LAB — COMPREHENSIVE METABOLIC PANEL
ALT: 8 U/L — ABNORMAL LOW (ref 14–54)
ANION GAP: 5 (ref 5–15)
AST: 15 U/L (ref 15–41)
Albumin: 3.4 g/dL — ABNORMAL LOW (ref 3.5–5.0)
Alkaline Phosphatase: 39 U/L (ref 38–126)
BUN: 8 mg/dL (ref 6–20)
CALCIUM: 8.4 mg/dL — AB (ref 8.9–10.3)
CO2: 21 mmol/L — ABNORMAL LOW (ref 22–32)
Chloride: 109 mmol/L (ref 101–111)
Creatinine, Ser: 0.48 mg/dL (ref 0.44–1.00)
GFR calc Af Amer: >60 mL/min (ref >60)
GLUCOSE: 84 mg/dL (ref 65–99)
Potassium: 3.6 mmol/L (ref 3.5–5.1)
SODIUM: 135 mmol/L (ref 135–145)
Total Bilirubin: 0.6 mg/dL (ref 0.3–1.2)
Total Protein: 7.2 g/dL (ref 6.5–8.1)

## 2015-02-04 LAB — CBC WITH DIFFERENTIAL/PLATELET
BASOS ABS: 0 10*3/uL (ref 0.0–0.1)
Basophils Relative: 0 % (ref 0–1)
Eosinophils Absolute: 0 10*3/uL (ref 0.0–0.7)
Eosinophils Relative: 0 % (ref 0–5)
HEMATOCRIT: 34.8 % — AB (ref 36.0–46.0)
Hemoglobin: 11.7 g/dL — ABNORMAL LOW (ref 12.0–15.0)
LYMPHS ABS: 1.5 10*3/uL (ref 0.7–4.0)
Lymphocytes Relative: 16 % (ref 12–46)
MCH: 29.7 pg (ref 26.0–34.0)
MCHC: 33.6 g/dL (ref 30.0–36.0)
MCV: 88.3 fL (ref 78.0–100.0)
Monocytes Absolute: 0.5 10*3/uL (ref 0.1–1.0)
Monocytes Relative: 6 % (ref 3–12)
Neutro Abs: 7.2 10*3/uL (ref 1.7–7.7)
Neutrophils Relative %: 78 % — ABNORMAL HIGH (ref 43–77)
Platelets: 189 10*3/uL (ref 150–400)
RBC: 3.94 MIL/uL (ref 3.87–5.11)
RDW: 13.2 % (ref 11.5–15.5)
WBC: 9.2 10*3/uL (ref 4.0–10.5)

## 2015-02-04 LAB — URINALYSIS, ROUTINE W REFLEX MICROSCOPIC
BILIRUBIN URINE: NEGATIVE
GLUCOSE, UA: 250 mg/dL — AB
Hgb urine dipstick: NEGATIVE
KETONES UR: 15 mg/dL — AB
LEUKOCYTES UA: NEGATIVE
Nitrite: NEGATIVE
PH: 6 (ref 5.0–8.0)
PROTEIN: 30 mg/dL — AB
Specific Gravity, Urine: >1.03 — ABNORMAL HIGH (ref 1.005–1.030)
Urobilinogen, UA: 0.2 mg/dL (ref 0.0–1.0)

## 2015-02-04 LAB — POCT URINALYSIS DIPSTICK
Bilirubin, UA: NEGATIVE
Blood, UA: NEGATIVE
Glucose, UA: NEGATIVE
Ketones, UA: NEGATIVE
Leukocytes, UA: NEGATIVE
NITRITE UA: NEGATIVE
PH UA: 5
Protein, UA: NEGATIVE
Spec Grav, UA: 1.02
UROBILINOGEN UA: NEGATIVE

## 2015-02-04 LAB — PROTEIN / CREATININE RATIO, URINE
Creatinine, Urine: 323 mg/dL
Protein Creatinine Ratio: 0.1 mg/mg{Cre} (ref 0.00–0.15)
TOTAL PROTEIN, URINE: 31 mg/dL

## 2015-02-04 LAB — URIC ACID: Uric Acid, Serum: 4.3 mg/dL (ref 2.3–6.6)

## 2015-02-04 LAB — URINE MICROSCOPIC-ADD ON

## 2015-02-04 LAB — LACTATE DEHYDROGENASE: LDH: 105 U/L (ref 98–192)

## 2015-02-04 MED ORDER — HYDROXYZINE PAMOATE 25 MG PO CAPS: ORAL_CAPSULE | ORAL | Status: DC
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
## 2015-02-04 NOTE — MAU Note (Addendum)
BP up in office today.    Hx of pre-eclampsia with first preg, induced at 33wks.  Getting 17p injections. Sent for further eval.  Denies HA, visual changes, epigastric pain or increase in swelling

## 2015-02-04 NOTE — MAU Provider Note (Signed)
History     CSN: [id number]  Arrival date and time: 02/04/15 1140   First Provider Initiated Contact with Patient 02/04/15 1216      Chief Complaint  Patient presents with  . Hypertension   HPI Stephanie [name] is a 24 y.o. [phone] at [redacted]w[redacted]d who presents to MAU today from the office with complaint of elevated BP. The patient denies headache, blurred vision, floaters, abdominal pain, bleeding, LOF, contractions or peripheral edema. She reports good fetal movement. She states a history of PIH with first pregnancy and preterm delivery with third pregnancy.   OB History    Gravida Para Term Preterm AB TAB SAB Ectopic Multiple Living   0 0 0 0 0 3      Past Medical History  Diagnosis Date  . UTI (urinary tract infection)   . Hypertension     Past Surgical History  Procedure Laterality Date  . No past surgeries      Family History  Problem Relation Age of Onset  . Diabetes Father   . Hypertension Father   . Hyperlipidemia Father     History  Substance Use Topics  . Smoking status: Never Smoker   . Smokeless tobacco: Never Used  . Alcohol Use: No    Allergies: No Known Allergies  No prescriptions prior to admission    Review of Systems  Constitutional: Negative for fever and malaise/fatigue.  Eyes: Negative for blurred vision.       Neg - floaters  Cardiovascular: Negative for leg swelling.  Gastrointestinal: Negative for abdominal pain.  Genitourinary:       Neg - vaginal bleeding, LOF  Neurological: Negative for headaches.   Physical Exam   Blood pressure 117/76, pulse 93, temperature 98.6 F (37 C), temperature source Oral, resp. rate 16, last menstrual period 08/14/2014.  Physical Exam  Nursing note and vitals reviewed. Constitutional: She is oriented to person, place, and time. She appears well-developed and well-nourished. No distress.  HENT:  Head: Normocephalic and atraumatic.  Cardiovascular: Normal rate.   Respiratory: Effort  normal.  GI: Soft. She exhibits no distension and no mass. There is no tenderness. There is no rebound and no guarding.  Musculoskeletal: She exhibits no edema.  Neurological: She is alert and oriented to person, place, and time. She has normal reflexes.  No clonus  Skin: Skin is warm and dry. No erythema.  Psychiatric: She has a normal mood and affect.   Results for orders placed or performed during the hospital encounter of 02/04/15 (from the past 24 hour(s))  Protein / creatinine ratio, urine     Status: None   Collection Time: 02/04/15 11:55 AM  Result Value Ref Range   Creatinine, Urine 323.00 mg/dL   Total Protein, Urine 31 mg/dL   Protein Creatinine Ratio 0.10 0.00 - 0.15 mg/mg[Cre]  Urinalysis, Routine w reflex microscopic (not at [facility])     Status: Abnormal   Collection Time: 02/04/15 11:55 AM  Result Value Ref Range   Color, Urine YELLOW YELLOW   APPearance HAZY (A) CLEAR   Specific Gravity, Urine >1.030 (H) 1.005 - 1.030   pH 6.0 5.0 - 8.0   Glucose, UA 250 (A) NEGATIVE mg/dL   Hgb urine dipstick NEGATIVE NEGATIVE   Bilirubin Urine NEGATIVE NEGATIVE   Ketones, ur 15 (A) NEGATIVE mg/dL   Protein, ur 30 (A) NEGATIVE mg/dL   Urobilinogen, UA 0.2 0.0 - 1.0 mg/dL   Nitrite NEGATIVE NEGATIVE   Leukocytes, UA NEGATIVE  NEGATIVE  Urine microscopic-add on     Status: Abnormal   Collection Time: 02/04/15 11:55 AM  Result Value Ref Range   Squamous Epithelial / LPF FEW (A) RARE   WBC, UA 0-2 <3 WBC/hpf   Urine-Other MUCOUS PRESENT   CBC with Differential/Platelet     Status: Abnormal   Collection Time: 02/04/15 12:19 PM  Result Value Ref Range   WBC 9.2 4.0 - 10.5 K/uL   RBC 3.94 3.87 - 5.11 MIL/uL   Hemoglobin 11.7 (L) 12.0 - 15.0 g/dL   HCT 16.1 (L) 09.6 - 04.5 %   MCV 88.3 78.0 - 100.0 fL   MCH 29.7 26.0 - 34.0 pg   MCHC 33.6 30.0 - 36.0 g/dL   RDW 40.9 81.1 - 91.4 %   Platelets 189 150 - 400 K/uL   Neutrophils Relative % 78 (H) 43 - 77 %   Neutro Abs 7.2 1.7 - 7.7  K/uL   Lymphocytes Relative 16 12 - 46 %   Lymphs Abs 1.5 0.7 - 4.0 K/uL   Monocytes Relative 6 3 - 12 %   Monocytes Absolute 0.5 0.1 - 1.0 K/uL   Eosinophils Relative 0 0 - 5 %   Eosinophils Absolute 0.0 0.0 - 0.7 K/uL   Basophils Relative 0 0 - 1 %   Basophils Absolute 0.0 0.0 - 0.1 K/uL  Comprehensive metabolic panel     Status: Abnormal   Collection Time: 02/04/15 12:19 PM  Result Value Ref Range   Sodium 135 135 - 145 mmol/L   Potassium 3.6 3.5 - 5.1 mmol/L   Chloride 109 101 - 111 mmol/L   CO2 21 (L) 22 - 32 mmol/L   Glucose, Bld 84 65 - 99 mg/dL   BUN 8 6 - 20 mg/dL   Creatinine, Ser 7.82 0.44 - 1.00 mg/dL   Calcium 8.4 (L) 8.9 - 10.3 mg/dL   Total Protein 7.2 6.5 - 8.1 g/dL   Albumin 3.4 (L) 3.5 - 5.0 g/dL   AST 15 15 - 41 U/L   ALT 8 (L) 14 - 54 U/L   Alkaline Phosphatase 39 38 - 126 U/L   Total Bilirubin 0.6 0.3 - 1.2 mg/dL   GFR calc non Af Amer >60 >60 mL/min   GFR calc Af Amer >60 >60 mL/min   Anion gap 5 5 - 15  Uric acid     Status: None   Collection Time: 02/04/15 12:19 PM  Result Value Ref Range   Uric Acid, Serum 4.3 2.3 - 6.6 mg/dL  Lactate dehydrogenase     Status: None   Collection Time: 02/04/15 12:19 PM  Result Value Ref Range   LDH 105 98 - 192 U/L   Fetal Monitoring: Baseline: 140 bpm, moderate variability, + 10 x 10 accelerations, no decelarations Contractions: none  MAU Course  Procedures None  MDM UA, Urine protein/creatinine ratio, CBC, CMP, uric acid, LDH today Serial BPs Discussed patient with Dr. Gaynell Face, including VS, labs results and patient presentation. Recommends discharge at this time and follow-up in the office with Dr. Clearance Coots for further management of possible chronic vs gestational HTN.  Assessment and Plan  A: SIUP at [redacted]w[redacted]d Chronic vs gestation HTN in pregnancy  P: Discharge home Pre-eclampsia precautions discussed Patient advised to follow-up with Dr. Clearance Coots next week for further management or sooner if symptoms  worsen Patient may return to MAU as needed or if her condition were to change or worsen   Marny Lowenstein, PA-C  02/04/2015,  5:54 PM

## 2015-02-04 NOTE — Discharge Instructions (Signed)

## 2015-02-04 NOTE — Addendum Note (Signed)
Addended by: Marya Landry D on: 02/04/2015 01:40 PM   Modules accepted: Orders

## 2015-02-04 NOTE — MAU Note (Signed)
Sent from OB's office for PIH eval;  

## 2015-02-05 LAB — GLUCOSE TOLERANCE, 2 HOURS W/ 1HR
Glucose, 1 hour: 125 mg/dL (ref 70–170)
Glucose, 2 hour: 92 mg/dL (ref 70–139)
Glucose, Fasting: 100 mg/dL — ABNORMAL HIGH (ref 65–99)

## 2015-02-05 LAB — RPR

## 2015-02-05 LAB — HIV ANTIBODY (ROUTINE TESTING W REFLEX): HIV 1&2 Ab, 4th Generation: NONREACTIVE

## 2015-02-08 ENCOUNTER — Ambulatory Visit (INDEPENDENT_AMBULATORY_CARE_PROVIDER_SITE_OTHER): Payer: Medicaid Other | Admitting: Certified Nurse Midwife

## 2015-02-08 VITALS — BP 128/81 | HR 87 | Temp 97.9°F | Wt 156.0 lb

## 2015-02-08 DIAGNOSIS — O0992 Supervision of high risk pregnancy, unspecified, second trimester: Secondary | ICD-10-CM
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
## 2015-02-11 ENCOUNTER — Other Ambulatory Visit: Payer: Self-pay | Admitting: Certified Nurse Midwife

## 2015-02-11 ENCOUNTER — Ambulatory Visit (INDEPENDENT_AMBULATORY_CARE_PROVIDER_SITE_OTHER): Payer: Medicaid Other | Admitting: *Deleted

## 2015-02-11 ENCOUNTER — Ambulatory Visit (INDEPENDENT_AMBULATORY_CARE_PROVIDER_SITE_OTHER): Payer: Medicaid Other | Admitting: Certified Nurse Midwife

## 2015-02-11 VITALS — BP 123/79 | HR 72 | Temp 98.0°F | Ht 64.0 in | Wt 158.0 lb

## 2015-02-11 DIAGNOSIS — Z8751 Personal history of pre-term labor: Secondary | ICD-10-CM

## 2015-02-11 DIAGNOSIS — O0992 Supervision of high risk pregnancy, unspecified, second trimester: Secondary | ICD-10-CM

## 2015-02-11 NOTE — Progress Notes (Signed)
Patient in office today for a 17-P injection. Patient has some complaints of spotting. Patient was added to the midwife's schedule and seen by her for this spotting.   BP 123/79 mmHg  Pulse 72  Temp(Src) 98 F (36.7 C)  Ht  (1.626 m)  Wt 158 lb (71.668 kg)  BMI 27.11 kg/m2  LMP 08/14/2014 (Exact Date)  Administrations This Visit    hydroxyprogesterone caproate (DELALUTIN) 250 mg/mL injection 250 mg    Admin Date Action Dose Route Administered By         02/11/2015 Given 250 mg Intramuscular Henriette Combs, LPN
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
## 2015-02-18 ENCOUNTER — Other Ambulatory Visit (INDEPENDENT_AMBULATORY_CARE_PROVIDER_SITE_OTHER): Payer: Medicaid Other

## 2015-02-18 VITALS — BP 115/77 | HR 63 | Temp 98.2°F | Wt 159.0 lb

## 2015-02-18 DIAGNOSIS — Z8751 Personal history of pre-term labor: Secondary | ICD-10-CM | POA: Diagnosis not present

## 2015-02-18 NOTE — Progress Notes (Signed)
Patient office today for her 17-P injection. Patient tolerated injection well. Patient to return in one week for next injection.   BP 115/77 mmHg  Pulse 63  Temp(Src) 98.2 F (36.8 C)  Wt 159 lb (72.122 kg)  LMP 08/14/2014 (Exact Date)  Administrations This Visit    hydroxyprogesterone caproate (DELALUTIN) 250 mg/mL injection 250 mg    Admin Date Action Dose Route Administered By         02/18/2015 Given 250 mg Intramuscular Henriette Combs, LPN

## 2015-02-25 ENCOUNTER — Ambulatory Visit (INDEPENDENT_AMBULATORY_CARE_PROVIDER_SITE_OTHER): Payer: Medicaid Other | Admitting: Obstetrics

## 2015-02-25 VITALS — BP 120/81 | HR 76 | Temp 98.0°F | Wt 157.0 lb

## 2015-02-25 DIAGNOSIS — Z3402 Encounter for supervision of normal first pregnancy, second trimester: Secondary | ICD-10-CM | POA: Diagnosis not present

## 2015-02-25 DIAGNOSIS — Z3403 Encounter for supervision of normal first pregnancy, third trimester: Secondary | ICD-10-CM

## 2015-02-25 DIAGNOSIS — O09892 Supervision of other high risk pregnancies, second trimester: Secondary | ICD-10-CM

## 2015-02-25 DIAGNOSIS — O0992 Supervision of high risk pregnancy, unspecified, second trimester: Secondary | ICD-10-CM

## 2015-02-25 DIAGNOSIS — O09212 Supervision of pregnancy with history of pre-term labor, second trimester: Secondary | ICD-10-CM

## 2015-02-25 LAB — POCT URINALYSIS DIPSTICK
Bilirubin, UA: NEGATIVE
Blood, UA: NEGATIVE
Glucose, UA: NEGATIVE
Ketones, UA: NEGATIVE
LEUKOCYTES UA: NEGATIVE
Nitrite, UA: NEGATIVE
PROTEIN UA: NEGATIVE
Spec Grav, UA: 1.015
UROBILINOGEN UA: NEGATIVE
pH, UA: 5

## 2015-02-25 NOTE — Progress Notes (Signed)
Pt received 17 p injection at today's visit. Pt tolerated injection well. 

## 2015-02-26 ENCOUNTER — Encounter: Payer: Self-pay | Admitting: Obstetrics
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
## 2015-03-01 ENCOUNTER — Encounter: Payer: Medicaid Other | Admitting: Certified Nurse Midwife

## 2015-03-04 ENCOUNTER — Ambulatory Visit: Payer: Medicaid Other | Admitting: *Deleted

## 2015-03-04 NOTE — Progress Notes (Signed)
Pt was seen in the office today to receive her 17p injection. Pt. Received injection in the left buttocks area this visit. Pt took injection well with no problems and was released. Pt due back  For next injection on March 11, 2015.  Lot# [id number] F Exp. 05-2017  Injection was prepared and given by Blenda Mounts on 03-04-15.

## 2015-03-11 ENCOUNTER — Ambulatory Visit (INDEPENDENT_AMBULATORY_CARE_PROVIDER_SITE_OTHER): Payer: Medicaid Other | Admitting: Certified Nurse Midwife

## 2015-03-11 VITALS — BP 130/86 | HR 73 | Temp 97.9°F | Wt 159.0 lb

## 2015-03-11 DIAGNOSIS — O0993 Supervision of high risk pregnancy, unspecified, third trimester: Secondary | ICD-10-CM
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
## 2015-03-18 ENCOUNTER — Ambulatory Visit (INDEPENDENT_AMBULATORY_CARE_PROVIDER_SITE_OTHER): Payer: Medicaid Other | Admitting: *Deleted

## 2015-03-18 VITALS — Temp 98.3°F | Wt 160.8 lb

## 2015-03-18 DIAGNOSIS — O09893 Supervision of other high risk pregnancies, third trimester: Secondary | ICD-10-CM

## 2015-03-18 DIAGNOSIS — O09213 Supervision of pregnancy with history of pre-term labor, third trimester: Secondary | ICD-10-CM

## 2015-03-18 MED ORDER — HYDROXYPROGESTERONE CAPROATE 250 MG/ML IM OIL
250.0000 mg | TOPICAL_OIL | INTRAMUSCULAR | Status: AC
  Administered 2015-03-18 – 2015-04-15 (×5): 250 mg via INTRAMUSCULAR

## 2015-03-18 NOTE — Progress Notes (Signed)
Pt is in office today for 17p injection.  Pt states that she is doing well.   Injection given, pt tolerated well.  Pt has no other concerns today.  Pt advised to contact office as needed.  Temp(Src) 98.3 F (36.8 C)  Wt 160 lb 12.8 oz (72.938 kg)  LMP 08/14/2014 (Exact Date)  Administrations This Visit    hydroxyprogesterone caproate (DELALUTIN) 250 mg/mL injection 250 mg    Admin Date Action Dose Route Administered By         03/18/2015 Given 250 mg Intramuscular Lanney Gins, CMA

## 2015-03-25 ENCOUNTER — Ambulatory Visit (INDEPENDENT_AMBULATORY_CARE_PROVIDER_SITE_OTHER): Payer: Medicaid Other | Admitting: Certified Nurse Midwife

## 2015-03-25 VITALS — BP 123/86 | HR 70 | Temp 97.5°F | Wt 163.0 lb

## 2015-03-25 DIAGNOSIS — Z23 Encounter for immunization: Secondary | ICD-10-CM

## 2015-03-25 DIAGNOSIS — O0993 Supervision of high risk pregnancy, unspecified, third trimester: Secondary | ICD-10-CM

## 2015-03-25 LAB — POCT URINALYSIS DIPSTICK
BILIRUBIN UA: NEGATIVE
Glucose, UA: NEGATIVE
KETONES UA: NEGATIVE
Leukocytes, UA: NEGATIVE
Nitrite, UA: NEGATIVE
PH UA: 5
Protein, UA: NEGATIVE
RBC UA: NEGATIVE
Spec Grav, UA: 1.015
Urobilinogen, UA: NEGATIVE

## 2015-03-25 MED ORDER — TETANUS-DIPHTH-ACELL PERTUSSIS 5-2.5-18.5 LF-MCG/0.5 IM SUSP
0.5000 mL | Freq: Once | INTRAMUSCULAR | Status: AC
  Administered 2015-03-25: 0.5 mL via INTRAMUSCULAR
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
## 2015-03-29 ENCOUNTER — Other Ambulatory Visit: Payer: Self-pay | Admitting: Certified Nurse Midwife

## 2015-03-29 ENCOUNTER — Ambulatory Visit (HOSPITAL_COMMUNITY)
Admission: RE | Admit: 2015-03-29 | Discharge: 2015-03-29 | Disposition: A | Discharge status: Home / Self Care | Payer: Medicaid Other | Source: Ambulatory Visit | Attending: Certified Nurse Midwife | Admitting: Certified Nurse Midwife

## 2015-03-29 ENCOUNTER — Encounter (HOSPITAL_COMMUNITY): Payer: Self-pay

## 2015-03-29 DIAGNOSIS — O09293 Supervision of pregnancy with other poor reproductive or obstetric history, third trimester: Secondary | ICD-10-CM | POA: Diagnosis present

## 2015-03-29 DIAGNOSIS — Z3A32 32 weeks gestation of pregnancy: Secondary | ICD-10-CM | POA: Insufficient documentation

## 2015-03-29 DIAGNOSIS — O09213 Supervision of pregnancy with history of pre-term labor, third trimester: Secondary | ICD-10-CM

## 2015-03-29 DIAGNOSIS — O0993 Supervision of high risk pregnancy, unspecified, third trimester: Secondary | ICD-10-CM

## 2015-03-31 ENCOUNTER — Ambulatory Visit (INDEPENDENT_AMBULATORY_CARE_PROVIDER_SITE_OTHER): Payer: Medicaid Other | Admitting: *Deleted

## 2015-03-31 ENCOUNTER — Other Ambulatory Visit: Payer: Self-pay | Admitting: Certified Nurse Midwife

## 2015-03-31 VITALS — BP 129/85 | HR 94 | Temp 98.1°F | Wt 164.0 lb

## 2015-03-31 DIAGNOSIS — O09213 Supervision of pregnancy with history of pre-term labor, third trimester: Secondary | ICD-10-CM

## 2015-03-31 DIAGNOSIS — Z8751 Personal history of pre-term labor: Secondary | ICD-10-CM

## 2015-03-31 NOTE — Progress Notes (Signed)
Patient in office today for a 17-P injection. Patient advised to return to office for next injection next week.  BP 129/85 mmHg  Pulse 94  Temp(Src) 98.1 F (36.7 C)  Wt 164 lb (74.39 kg)  LMP 08/14/2014 (Exact Date)

## 2015-04-08 ENCOUNTER — Encounter: Payer: Medicaid Other | Admitting: Certified Nurse Midwife

## 2015-04-08 ENCOUNTER — Encounter: Payer: Self-pay | Admitting: Obstetrics

## 2015-04-08 ENCOUNTER — Ambulatory Visit (INDEPENDENT_AMBULATORY_CARE_PROVIDER_SITE_OTHER): Payer: Medicaid Other | Admitting: Obstetrics

## 2015-04-08 VITALS — BP 122/87 | HR 60 | Temp 97.8°F | Wt 163.0 lb

## 2015-04-08 DIAGNOSIS — O09213 Supervision of pregnancy with history of pre-term labor, third trimester: Secondary | ICD-10-CM

## 2015-04-08 DIAGNOSIS — O0993 Supervision of high risk pregnancy, unspecified, third trimester: Secondary | ICD-10-CM
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
## 2015-04-08 NOTE — Progress Notes (Signed)
Pt received 17P injection at today's visit.  Pt tolerated well.

## 2015-04-15 ENCOUNTER — Encounter: Payer: Self-pay | Admitting: Obstetrics

## 2015-04-15 ENCOUNTER — Ambulatory Visit (INDEPENDENT_AMBULATORY_CARE_PROVIDER_SITE_OTHER): Payer: Medicaid Other | Admitting: Obstetrics

## 2015-04-15 VITALS — BP 121/78 | HR 73 | Temp 98.4°F | Wt 165.0 lb

## 2015-04-15 DIAGNOSIS — O09213 Supervision of pregnancy with history of pre-term labor, third trimester: Secondary | ICD-10-CM | POA: Diagnosis not present

## 2015-04-15 DIAGNOSIS — K219 Gastro-esophageal reflux disease without esophagitis: Secondary | ICD-10-CM

## 2015-04-15 DIAGNOSIS — Z3483 Encounter for supervision of other normal pregnancy, third trimester: Secondary | ICD-10-CM

## 2015-04-15 LAB — POCT URINALYSIS DIPSTICK
Bilirubin, UA: NEGATIVE
Blood, UA: NEGATIVE
Glucose, UA: NEGATIVE
Ketones, UA: NEGATIVE
LEUKOCYTES UA: NEGATIVE
Nitrite, UA: NEGATIVE
PH UA: 5
SPEC GRAV UA: 1.02
UROBILINOGEN UA: NEGATIVE

## 2015-04-15 MED ORDER — RANITIDINE HCL 150 MG PO TABS: 150.0000 mg | ORAL_TABLET | Freq: Two times a day (BID) | ORAL | Status: DC

## 2015-04-15 NOTE — Progress Notes (Signed)
Zantac Rx 
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
## 2015-04-15 NOTE — Addendum Note (Signed)
Addended by: Coral Ceo A on: 04/15/2015 02:04 PM   Modules accepted: Orders

## 2015-04-17 LAB — STREP B DNA PROBE: STREP GROUP B AG: DETECTED

## 2015-04-20 ENCOUNTER — Other Ambulatory Visit: Payer: Self-pay | Admitting: Certified Nurse Midwife

## 2015-04-21 ENCOUNTER — Ambulatory Visit (INDEPENDENT_AMBULATORY_CARE_PROVIDER_SITE_OTHER): Payer: Medicaid Other | Admitting: Certified Nurse Midwife

## 2015-04-21 VITALS — BP 130/79 | HR 58 | Temp 98.2°F | Wt 167.8 lb

## 2015-04-21 DIAGNOSIS — O09213 Supervision of pregnancy with history of pre-term labor, third trimester: Secondary | ICD-10-CM | POA: Diagnosis not present

## 2015-04-21 DIAGNOSIS — Z3483 Encounter for supervision of other normal pregnancy, third trimester: Secondary | ICD-10-CM

## 2015-04-21 LAB — POCT URINALYSIS DIPSTICK
BILIRUBIN UA: NEGATIVE
Glucose, UA: NEGATIVE
Ketones, UA: NEGATIVE
Leukocytes, UA: NEGATIVE
NITRITE UA: NEGATIVE
PH UA: 5.5
Protein, UA: NEGATIVE
RBC UA: NEGATIVE
SPEC GRAV UA: 1.015
UROBILINOGEN UA: NEGATIVE

## 2015-04-21 MED ORDER — HYDROXYPROGESTERONE CAPROATE 250 MG/ML IM OIL
250.0000 mg | TOPICAL_OIL | Freq: Once | INTRAMUSCULAR | Status: AC
  Administered 2015-04-21: 250 mg via INTRAMUSCULAR
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
## 2015-04-26 ENCOUNTER — Other Ambulatory Visit (HOSPITAL_COMMUNITY): Payer: Self-pay | Admitting: Maternal and Fetal Medicine

## 2015-04-26 ENCOUNTER — Inpatient Hospital Stay (HOSPITAL_COMMUNITY)
Admission: AD | Admit: 2015-04-26 | Discharge: 2015-04-26 | Disposition: A | Discharge status: Home / Self Care | Payer: Medicaid Other | Source: Ambulatory Visit | Attending: Obstetrics | Admitting: Obstetrics

## 2015-04-26 ENCOUNTER — Encounter (HOSPITAL_COMMUNITY): Payer: Self-pay | Admitting: *Deleted

## 2015-04-26 ENCOUNTER — Encounter (HOSPITAL_COMMUNITY): Payer: Self-pay

## 2015-04-26 ENCOUNTER — Ambulatory Visit (HOSPITAL_COMMUNITY)
Admission: RE | Admit: 2015-04-26 | Discharge: 2015-04-26 | Disposition: A | Discharge status: Home / Self Care | Payer: Medicaid Other | Source: Ambulatory Visit | Attending: Certified Nurse Midwife | Admitting: Certified Nurse Midwife

## 2015-04-26 VITALS — BP 134/92 | HR 54 | Wt 167.6 lb

## 2015-04-26 DIAGNOSIS — O09213 Supervision of pregnancy with history of pre-term labor, third trimester: Secondary | ICD-10-CM

## 2015-04-26 DIAGNOSIS — O133 Gestational [pregnancy-induced] hypertension without significant proteinuria, third trimester: Secondary | ICD-10-CM | POA: Diagnosis not present

## 2015-04-26 DIAGNOSIS — O09293 Supervision of pregnancy with other poor reproductive or obstetric history, third trimester: Secondary | ICD-10-CM | POA: Diagnosis present

## 2015-04-26 DIAGNOSIS — R030 Elevated blood-pressure reading, without diagnosis of hypertension: Secondary | ICD-10-CM | POA: Diagnosis present

## 2015-04-26 DIAGNOSIS — Z3A36 36 weeks gestation of pregnancy: Secondary | ICD-10-CM | POA: Insufficient documentation

## 2015-04-26 DIAGNOSIS — O365930 Maternal care for other known or suspected poor fetal growth, third trimester, not applicable or unspecified: Secondary | ICD-10-CM

## 2015-04-26 LAB — COMPREHENSIVE METABOLIC PANEL
ALBUMIN: 3 g/dL — AB (ref 3.5–5.0)
ALK PHOS: 105 U/L (ref 38–126)
ALT: 8 U/L — ABNORMAL LOW (ref 14–54)
ANION GAP: 6 (ref 5–15)
AST: 15 U/L (ref 15–41)
BUN: 14 mg/dL (ref 6–20)
CALCIUM: 8.3 mg/dL — AB (ref 8.9–10.3)
CO2: 20 mmol/L — AB (ref 22–32)
Chloride: 109 mmol/L (ref 101–111)
Creatinine, Ser: 0.64 mg/dL (ref 0.44–1.00)
GFR calc Af Amer: >60 mL/min (ref >60)
GFR calc non Af Amer: >60 mL/min (ref >60)
GLUCOSE: 74 mg/dL (ref 65–99)
POTASSIUM: 3.9 mmol/L (ref 3.5–5.1)
SODIUM: 135 mmol/L (ref 135–145)
Total Bilirubin: 0.6 mg/dL (ref 0.3–1.2)
Total Protein: 6.4 g/dL — ABNORMAL LOW (ref 6.5–8.1)

## 2015-04-26 LAB — URINALYSIS, ROUTINE W REFLEX MICROSCOPIC
Bilirubin Urine: NEGATIVE
Glucose, UA: NEGATIVE mg/dL
Hgb urine dipstick: NEGATIVE
KETONES UR: 15 mg/dL — AB
LEUKOCYTES UA: NEGATIVE
NITRITE: NEGATIVE
PROTEIN: NEGATIVE mg/dL
Specific Gravity, Urine: >1.03 — ABNORMAL HIGH (ref 1.005–1.030)
Urobilinogen, UA: 0.2 mg/dL (ref 0.0–1.0)
pH: 6 (ref 5.0–8.0)

## 2015-04-26 LAB — CBC
HEMATOCRIT: 35 % — AB (ref 36.0–46.0)
HEMOGLOBIN: 11.4 g/dL — AB (ref 12.0–15.0)
MCH: 26.6 pg (ref 26.0–34.0)
MCHC: 32.6 g/dL (ref 30.0–36.0)
MCV: 81.6 fL (ref 78.0–100.0)
Platelets: 159 10*3/uL (ref 150–400)
RBC: 4.29 MIL/uL (ref 3.87–5.11)
RDW: 14 % (ref 11.5–15.5)
WBC: 7.3 10*3/uL (ref 4.0–10.5)

## 2015-04-26 LAB — PROTEIN / CREATININE RATIO, URINE
Creatinine, Urine: 247 mg/dL
PROTEIN CREATININE RATIO: 0.14 mg/mg{creat} (ref 0.00–0.15)
Total Protein, Urine: 34 mg/dL

## 2015-04-26 NOTE — ED Notes (Signed)
Dr. Octavia HeirHaper notified of elevated blood pressures in MFM.  Notified of hx of pre-eclampsia with previous pregnancy and baby measuring small today.  Pt sent to MAU for PIH eval.  Called MAU charge RN with report.

## 2015-04-26 NOTE — MAU Provider Note (Signed)
Chief Complaint:  Hypertension   First Provider Initiated Contact with Patient 04/26/15 1157     HPI: Stephanie [name] is a 24 y.o. [id number] at [redacted]w[redacted]d who was sent to maternity admissions from MFM for elevated BP. In antenatal testing for fetal growth restriction. BPP today 8/8. Denies HA, vision changes or epigastric pain. Good fetal movement.   Past Medical History: Past Medical History  Diagnosis Date  . UTI (urinary tract infection)   . Hypertension     Past obstetric history: OB History  Gravida Para Term Preterm AB SAB TAB Ectopic Multiple Living  0 0 0 0 0 3    # Outcome Date GA Lbr Len/2nd Weight Sex Delivery Anes PTL Lv  4 Current           3 Preterm 04/01/12 [redacted]w[redacted]d  3 lb 14 oz (1.758 kg) F Vag-Spont None Y Y     Complications: Preterm delivery, delivered  2 Term 12/31/10 [redacted]w[redacted]d  7 lb 14 oz (3.572 kg) M Vag-Spont None N Y  1 Term 10/20/08 [redacted]w[redacted]d  6 lb 1 oz (2.75 kg) F Vag-Spont None N Y     Complications: Pregnancy induced hypertension      Past Surgical History: Past Surgical History  Procedure Laterality Date  . No past surgeries       Family History: Family History  Problem Relation Age of Onset  . Diabetes Father   . Hypertension Father   . Hyperlipidemia Father     Social History: Social History  Substance Use Topics  . Smoking status: Never Smoker   . Smokeless tobacco: Never Used  . Alcohol Use: No    Allergies: No Known Allergies  Meds:  Prescriptions prior to admission  Medication Sig Dispense Refill Last Dose  . ranitidine (ZANTAC) 150 MG tablet Take 1 tablet (150 mg total) by mouth 2 (two) times daily. 60 tablet 5 04/25/2015 at Unknown time  . acetaminophen (TYLENOL) 325 MG tablet Take 650 mg by mouth every 6 (six) hours as needed for mild pain or headache.   prn  . calcium carbonate (TUMS - DOSED IN MG ELEMENTAL CALCIUM) 500 MG chewable tablet Chew 1 tablet by mouth daily as needed for indigestion or heartburn.   prn  .  Doxylamine-Pyridoxine (DICLEGIS) 10-10 MG TBEC Take 1 tablet by mouth 3 (three) times daily. (Patient not taking: Reported on 04/26/2015) 100 tablet 3 Not Taking at Unknown time  . hydrOXYzine (VISTARIL) 25 MG capsule Take 1-2 tablets daily at bedtime for sleep. (Patient not taking: Reported on 04/26/2015) 30 capsule 0 Not Taking at Unknown time  . Prenat MV-Min-Methylfolate-FA (PRENATE) 0.6-0.4 MG CHEW Chew 1 tablet by mouth daily. (Patient not taking: Reported on 04/26/2015) 30 tablet 6 Not Taking at Unknown time    I have reviewed patient's Past Medical Hx, Surgical Hx, Family Hx, Social Hx, medications and allergies.   ROS:  Review of Systems  Eyes: Negative for visual disturbance.  Gastrointestinal: Negative for abdominal pain.  Genitourinary: Negative for vaginal bleeding.       Neg for LOF  Neurological: Negative for headaches.    Physical Exam   Patient Vitals for the past 24 hrs:  BP Pulse  04/26/15 1302 121/82 mmHg 69  04/26/15 1252 122/73 mmHg 60  04/26/15 1242 128/77 mmHg 68  04/26/15 1232 125/74 mmHg 64  04/26/15 1222 122/84 mmHg 73  04/26/15 1215 135/94 mmHg 62  04/26/15 1202 139/98 mmHg 63  04/26/15 1152 123/96 mmHg 74  04/26/15 1148 136/93 mmHg (!) 56   Constitutional: Well-developed, well-nourished female in no acute distress.  Cardiovascular: normal rate Respiratory: normal effort GI: Abd soft, non-tender, gravid appropriate for gestational age.  MS: Extremities nontender, Tr edema, normal ROM Neurologic: Alert and oriented x 4. DTRs 2+, no clonus GU: Deferred  FHT:  Baseline 125 , moderate variability, accelerations present, no decelerations Contractions: None   Labs: Results for orders placed or performed during the hospital encounter of 04/26/15 (from the past 24 hour(s))  CBC     Status: Abnormal   Collection Time: 04/26/15 12:05 PM  Result Value Ref Range   WBC 7.3 4.0 - 10.5 K/uL   RBC 4.29 3.87 - 5.11 MIL/uL   Hemoglobin 11.4 (L) 12.0 - 15.0  g/dL   HCT 16.135.0 (L) 09.636.0 - 04.546.0 %   MCV 81.6 78.0 - 100.0 fL   MCH 26.6 26.0 - 34.0 pg   MCHC 32.6 30.0 - 36.0 g/dL   RDW 40.914.0 81.111.5 - 91.415.5 %   Platelets 159 150 - 400 K/uL  Comprehensive metabolic panel     Status: Abnormal   Collection Time: 04/26/15 12:05 PM  Result Value Ref Range   Sodium 135 135 - 145 mmol/L   Potassium 3.9 3.5 - 5.1 mmol/L   Chloride 109 101 - 111 mmol/L   CO2 20 (L) 22 - 32 mmol/L   Glucose, Bld 74 65 - 99 mg/dL   BUN 14 6 - 20 mg/dL   Creatinine, Ser 7.820.64 0.44 - 1.00 mg/dL   Calcium 8.3 (L) 8.9 - 10.3 mg/dL   Total Protein 6.4 (L) 6.5 - 8.1 g/dL   Albumin 3.0 (L) 3.5 - 5.0 g/dL   AST 15 15 - 41 U/L   ALT 8 (L) 14 - 54 U/L   Alkaline Phosphatase 105 38 - 126 U/L   Total Bilirubin 0.6 0.3 - 1.2 mg/dL   GFR calc non Af Amer >60 >60 mL/min   GFR calc Af Amer >60 >60 mL/min   Anion gap 6 5 - 15  Protein / creatinine ratio, urine     Status: None   Collection Time: 04/26/15 12:12 PM  Result Value Ref Range   Creatinine, Urine 247.00 mg/dL   Total Protein, Urine 34 mg/dL   Protein Creatinine Ratio 0.14 0.00 - 0.15 mg/mg[Cre]  Urinalysis, Routine w reflex microscopic (not at [facility])     Status: Abnormal   Collection Time: 04/26/15 12:12 PM  Result Value Ref Range   Color, Urine YELLOW YELLOW   APPearance CLEAR CLEAR   Specific Gravity, Urine >1.030 (H) 1.005 - 1.030   pH 6.0 5.0 - 8.0   Glucose, UA NEGATIVE NEGATIVE mg/dL   Hgb urine dipstick NEGATIVE NEGATIVE   Bilirubin Urine NEGATIVE NEGATIVE   Ketones, ur 15 (A) NEGATIVE mg/dL   Protein, ur NEGATIVE NEGATIVE mg/dL   Urobilinogen, UA 0.2 0.0 - 1.0 mg/dL   Nitrite NEGATIVE NEGATIVE   Leukocytes, UA NEGATIVE NEGATIVE    Imaging:  BPP 8/8  MAU Course: CBC, CMET, UA, P:C  Labs normal. Discussed, BP, labs, FHR tracing w/ Dr. Clearance CootsHarper.   MDM:  Assessment: 1. Gestational hypertension w/o significant proteinuria in 3rd trimester     Plan: Discharge home in stable condition.  Pre-E and Labor  precautions and fetal kick counts     Follow-up Information    [facility].   Why:  Routine prenatal visit or sooner as needed if symptoms worsen  Contact information:   [address] [place] [address] [phone]      Follow up with [facility]  MATERNITY ADMISSIONS.   Why:  As needed in emergencies   Contact information:   [address] [id number] mc [place] [address] [phone]        Medication List    STOP taking these medications        Doxylamine-Pyridoxine 10-10 MG Tbec  Commonly known as:  DICLEGIS     hydrOXYzine 25 MG capsule  Commonly known as:  VISTARIL     PRENATE 0.6-0.4 MG Chew      TAKE these medications        acetaminophen 325 MG tablet  Commonly known as:  TYLENOL  Take 650 mg by mouth every 6 (six) hours as needed for mild pain or headache.     calcium carbonate 500 MG chewable tablet  Commonly known as:  TUMS - dosed in mg elemental calcium  Chew 1 tablet by mouth daily as needed for indigestion or heartburn.     ranitidine 150 MG tablet  Commonly known as:  ZANTAC  Take 1 tablet (150 mg total) by mouth 2 (two) times daily.        [place], CNM 04/26/2015 1:59 PM

## 2015-04-26 NOTE — Discharge Instructions (Signed)

## 2015-04-26 NOTE — MAU Note (Signed)
Sent from MFM for further evaluation. BP elevated, hx pre-eclampsia with other preg,   Baby is measuring small.  BPP today 8/8.

## 2015-04-27 ENCOUNTER — Encounter (HOSPITAL_COMMUNITY): Payer: Self-pay | Admitting: Advanced Practice Midwife

## 2015-04-27 ENCOUNTER — Other Ambulatory Visit: Payer: Self-pay | Admitting: Certified Nurse Midwife

## 2015-04-29 ENCOUNTER — Encounter (HOSPITAL_COMMUNITY): Payer: Self-pay | Admitting: Anesthesiology

## 2015-04-29 ENCOUNTER — Ambulatory Visit (INDEPENDENT_AMBULATORY_CARE_PROVIDER_SITE_OTHER): Payer: Medicaid Other | Admitting: Certified Nurse Midwife

## 2015-04-29 ENCOUNTER — Inpatient Hospital Stay (HOSPITAL_COMMUNITY)
Admission: AD | Admit: 2015-04-29 | Discharge: 2015-05-01 | DRG: 775 | Disposition: A | Discharge status: Home / Self Care | Payer: Medicaid Other | Source: Ambulatory Visit | Attending: Obstetrics | Admitting: Obstetrics

## 2015-04-29 ENCOUNTER — Encounter (HOSPITAL_COMMUNITY): Payer: Self-pay | Admitting: *Deleted

## 2015-04-29 VITALS — BP 138/91 | HR 78 | Temp 97.9°F | Wt 167.0 lb

## 2015-04-29 DIAGNOSIS — Z8249 Family history of ischemic heart disease and other diseases of the circulatory system: Secondary | ICD-10-CM

## 2015-04-29 DIAGNOSIS — O0993 Supervision of high risk pregnancy, unspecified, third trimester: Secondary | ICD-10-CM

## 2015-04-29 DIAGNOSIS — O134 Gestational [pregnancy-induced] hypertension without significant proteinuria, complicating childbirth: Principal | ICD-10-CM | POA: Diagnosis present

## 2015-04-29 DIAGNOSIS — O288 Other abnormal findings on antenatal screening of mother: Secondary | ICD-10-CM | POA: Diagnosis present

## 2015-04-29 DIAGNOSIS — Z833 Family history of diabetes mellitus: Secondary | ICD-10-CM | POA: Diagnosis not present

## 2015-04-29 DIAGNOSIS — R030 Elevated blood-pressure reading, without diagnosis of hypertension: Secondary | ICD-10-CM | POA: Diagnosis present

## 2015-04-29 DIAGNOSIS — O289 Unspecified abnormal findings on antenatal screening of mother: Secondary | ICD-10-CM

## 2015-04-29 DIAGNOSIS — O133 Gestational [pregnancy-induced] hypertension without significant proteinuria, third trimester: Secondary | ICD-10-CM

## 2015-04-29 DIAGNOSIS — Z3A36 36 weeks gestation of pregnancy: Secondary | ICD-10-CM | POA: Diagnosis not present

## 2015-04-29 LAB — CBC
HCT: 32.6 % — ABNORMAL LOW (ref 36.0–46.0)
HEMATOCRIT: 35.2 % — AB (ref 36.0–46.0)
HEMOGLOBIN: 10.7 g/dL — AB (ref 12.0–15.0)
Hemoglobin: 11.3 g/dL — ABNORMAL LOW (ref 12.0–15.0)
MCH: 26.7 pg (ref 26.0–34.0)
MCH: 26.3 pg (ref 26.0–34.0)
MCHC: 32.8 g/dL (ref 30.0–36.0)
MCHC: 32.1 g/dL (ref 30.0–36.0)
MCV: 81.3 fL (ref 78.0–100.0)
MCV: 82.1 fL (ref 78.0–100.0)
PLATELETS: 178 10*3/uL (ref 150–400)
PLATELETS: 161 10*3/uL (ref 150–400)
RBC: 4.01 MIL/uL (ref 3.87–5.11)
RBC: 4.29 MIL/uL (ref 3.87–5.11)
RDW: 13.9 % (ref 11.5–15.5)
RDW: 13.9 % (ref 11.5–15.5)
WBC: 6.1 10*3/uL (ref 4.0–10.5)
WBC: 11.5 10*3/uL — ABNORMAL HIGH (ref 4.0–10.5)

## 2015-04-29 LAB — PREPARE RBC (CROSSMATCH)

## 2015-04-29 MED ORDER — PENICILLIN G POTASSIUM 5000000 UNITS IJ SOLR
5.0000 10*6.[IU] | Freq: Once | INTRAVENOUS | Status: AC
  Administered 2015-04-29: 5 10*6.[IU] via INTRAVENOUS
  Filled 2015-04-29: qty 5

## 2015-04-29 MED ORDER — OXYCODONE-ACETAMINOPHEN 5-325 MG PO TABS: 2.0000 | ORAL_TABLET | ORAL | Status: DC | PRN

## 2015-04-29 MED ORDER — FLEET ENEMA 7-19 GM/118ML RE ENEM: 1.0000 | ENEMA | RECTAL | Status: DC | PRN

## 2015-04-29 MED ORDER — LACTATED RINGERS IV SOLN
INTRAVENOUS | Status: DC
  Administered 2015-04-29 (×2): via INTRAVENOUS

## 2015-04-29 MED ORDER — OXYTOCIN BOLUS FROM INFUSION
500.0000 mL | INTRAVENOUS | Status: DC
  Administered 2015-04-29: 500 mL via INTRAVENOUS

## 2015-04-29 MED ORDER — LIDOCAINE HCL (PF) 1 % IJ SOLN
30.0000 mL | INTRAMUSCULAR | Status: DC | PRN
  Filled 2015-04-29: qty 30

## 2015-04-29 MED ORDER — NALBUPHINE HCL 10 MG/ML IJ SOLN
10.0000 mg | INTRAMUSCULAR | Status: DC | PRN
  Administered 2015-04-29: 10 mg via INTRAMUSCULAR
  Filled 2015-04-29: qty 1

## 2015-04-29 MED ORDER — PENICILLIN G POTASSIUM 5000000 UNITS IJ SOLR
2.5000 10*6.[IU] | INTRAVENOUS | Status: DC
  Administered 2015-04-29 (×2): 2.5 10*6.[IU] via INTRAVENOUS
  Filled 2015-04-29 (×7): qty 2.5

## 2015-04-29 MED ORDER — PROMETHAZINE HCL 25 MG/ML IJ SOLN
25.0000 mg | Freq: Four times a day (QID) | INTRAMUSCULAR | Status: DC | PRN
  Administered 2015-04-29: 25 mg via INTRAMUSCULAR
  Filled 2015-04-29: qty 1

## 2015-04-29 MED ORDER — MISOPROSTOL 50MCG HALF TABLET
50.0000 ug | ORAL_TABLET | ORAL | Status: AC
  Administered 2015-04-29 (×2): 50 ug via ORAL
  Filled 2015-04-29: qty 1
  Filled 2015-04-29 (×2): qty 0.5

## 2015-04-29 MED ORDER — SODIUM CHLORIDE 0.9 % IV SOLN: Freq: Once | INTRAVENOUS | Status: DC

## 2015-04-29 MED ORDER — TERBUTALINE SULFATE 1 MG/ML IJ SOLN: 0.2500 mg | Freq: Once | INTRAMUSCULAR | Status: DC | PRN

## 2015-04-29 MED ORDER — OXYTOCIN 40 UNITS IN LACTATED RINGERS INFUSION - SIMPLE MED
1.0000 m[IU]/min | INTRAVENOUS | Status: DC
  Administered 2015-04-29: 1 m[IU]/min via INTRAVENOUS
  Filled 2015-04-29: qty 1000

## 2015-04-29 MED ORDER — LACTATED RINGERS IV SOLN: 500.0000 mL | INTRAVENOUS | Status: DC | PRN

## 2015-04-29 MED ORDER — OXYCODONE-ACETAMINOPHEN 5-325 MG PO TABS: 1.0000 | ORAL_TABLET | ORAL | Status: DC | PRN

## 2015-04-29 MED ORDER — CITRIC ACID-SODIUM CITRATE 334-500 MG/5ML PO SOLN: 30.0000 mL | ORAL | Status: DC | PRN

## 2015-04-29 MED ORDER — MAGNESIUM SULFATE BOLUS VIA INFUSION
4.0000 g | Freq: Once | INTRAVENOUS | Status: AC
  Administered 2015-04-29: 4 g via INTRAVENOUS
  Filled 2015-04-29: qty 500

## 2015-04-29 MED ORDER — NALBUPHINE HCL 10 MG/ML IJ SOLN
10.0000 mg | INTRAMUSCULAR | Status: DC | PRN
  Administered 2015-04-29: 10 mg via INTRAVENOUS
  Filled 2015-04-29: qty 1

## 2015-04-29 MED ORDER — MAGNESIUM SULFATE 50 % IJ SOLN
2.0000 g/h | INTRAVENOUS | Status: DC
  Filled 2015-04-29: qty 80

## 2015-04-29 MED ORDER — OXYTOCIN 40 UNITS IN LACTATED RINGERS INFUSION - SIMPLE MED: 62.5000 mL/h | INTRAVENOUS | Status: DC

## 2015-04-29 MED ORDER — ACETAMINOPHEN 325 MG PO TABS: 650.0000 mg | ORAL_TABLET | ORAL | Status: DC | PRN

## 2015-04-29 MED ORDER — ONDANSETRON HCL 4 MG/2ML IJ SOLN: 4.0000 mg | Freq: Four times a day (QID) | INTRAMUSCULAR | Status: DC | PRN

## 2015-04-29 NOTE — Progress Notes (Signed)
Dr Clearance CootsHarper called and notified of foley bulb being removed; SVE 5/70-80/-3, Ctx 2-3 mins apart; made aware of small-moderate bright red bleeding and few clots at this time; request for pain medication and new orders given; also made aware of increased blood pressures at this time and new orders for Mag given
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
## 2015-04-29 NOTE — Anesthesia Preprocedure Evaluation (Deleted)
Anesthesia Evaluation  Patient identified by MRN, date of birth, ID band Patient awake    Reviewed: Allergy & Precautions, NPO status , Patient's Chart, lab work & pertinent test results  Airway Mallampati: II  TM Distance: >3 FB Neck ROM: Full    Dental no notable dental hx. (+) Teeth Intact   Pulmonary neg pulmonary ROS,    Pulmonary exam normal breath sounds clear to auscultation       Cardiovascular hypertension,  Rhythm:Regular Rate:Normal     Neuro/Psych negative neurological ROS  negative psych ROS   GI/Hepatic Neg liver ROS, GERD  Medicated and Controlled,  Endo/Other  negative endocrine ROS  Renal/GU negative Renal ROS  negative genitourinary   Musculoskeletal negative musculoskeletal ROS (+)   Abdominal   Peds  Hematology  (+) anemia ,   Anesthesia Other Findings   Reproductive/Obstetrics (+) Pregnancy 36 6/7 weeks Bleeding- possible abruption Pre eclampsia - on MgSO4                             Anesthesia Physical Anesthesia Plan  ASA: II and emergent  Anesthesia Plan: Spinal   Post-op Pain Management:    Induction:   Airway Management Planned: Natural Airway  Additional Equipment:   Intra-op Plan:   Post-operative Plan:   Informed Consent: I have reviewed the patients History and Physical, chart, labs and discussed the procedure including the risks, benefits and alternatives for the proposed anesthesia with the patient or authorized representative who has indicated his/her understanding and acceptance.   Dental advisory given  Plan Discussed with: CRNA, Anesthesiologist and Surgeon  Anesthesia Plan Comments: (Patient for possible C/Section for abruption. )        Anesthesia Quick Evaluation

## 2015-04-29 NOTE — H&P (Signed)
Stephanie [name] is a 24 y.o. female presenting for IOL.  She has history of previous preterm delivery, and PTL with current pregnancy.  Seen at MFM today with elevated BP and decreased FM.  Patient admitted for IOL of labor at 36.6 weeks, per recommendation of MFM because she met BP criteria for induction.  Maternal Medical History:  Reason for admission: Elevated BP  Fetal activity: Perceived fetal activity is normal.   Last perceived fetal movement was within the past hour.    Prenatal complications: Preterm labor.     OB History    Gravida Para Term Preterm AB TAB SAB Ectopic Multiple Living   4 3 2 1  0 0 0 0 0 3     Past Medical History  Diagnosis Date  . UTI (urinary tract infection)   . Hypertension    Past Surgical History  Procedure Laterality Date  . No past surgeries     Family History: family history includes Diabetes in her father; Hyperlipidemia in her father; Hypertension in her father. Social History:  reports that she has never smoked. She has never used smokeless tobacco. She reports that she does not drink alcohol or use illicit drugs.   Prenatal Transfer Tool  Maternal Diabetes: No Genetic Screening: Normal Maternal Ultrasounds/Referrals: Normal Fetal Ultrasounds or other Referrals:  Referred to Materal Fetal Medicine  Maternal Substance Abuse:  No Significant Maternal Medications:  None Significant Maternal Lab Results:  None Other Comments:  None  Review of Systems  All other systems reviewed and are negative.   Dilation: 1.5 Effacement (%): 60 Station: -3 Exam by:: [email or web address], rn Blood pressure 123/78, pulse 74, temperature 98 F (36.7 C), temperature source Oral, resp. rate 20, height 5' 4"  (1.626 m), weight 167 lb (75.751 kg), last menstrual period 08/14/2014, SpO2 98 %.   Maternal Exam:  Abdomen: Patient reports no abdominal tenderness. Fetal presentation: vertex  Introitus: Normal vulva. Normal vagina.  Cervix: Cervix evaluated by  digital exam.     Physical Exam  Nursing note and vitals reviewed. Constitutional: She is oriented to person, place, and time. She appears well-developed and well-nourished.  HENT:  Head: Normocephalic and atraumatic.  Eyes: Conjunctivae are normal. Pupils are equal, round, and reactive to light.  Neck: Normal range of motion. Neck supple.  Cardiovascular: Normal rate and regular rhythm.   Respiratory: Effort normal and breath sounds normal.  GI: Soft.  Genitourinary: Vagina normal and uterus normal.  Musculoskeletal: Normal range of motion.  Neurological: She is alert and oriented to person, place, and time.  Skin: Skin is warm and dry.  Psychiatric: She has a normal mood and affect. Her behavior is normal. Judgment and thought content normal.    Prenatal labs: ABO, Rh: --/--/O POS (10/21 1045) Antibody: NEG (10/21 1045) Rubella: 1.02 (05/10 1124) RPR: NON REAC (07/29 1348)  HBsAg: NEGATIVE (05/10 1124)  HIV: NONREACTIVE (07/29 1348)  GBS: Detected (10/07 1110)      A/P:  36.6 weeks.  Gestational HTN.  Admit.  2 stage IOL.   Janayla Marik A 04/29/2015, 12:56 PM

## 2015-04-29 NOTE — Progress Notes (Signed)
Stephanie [name] is a 24 y.o. [phone] at 4521w6d by LMP admitted for Hypertension  Subjective:   Objective: BP 148/92 mmHg  Pulse 86  Temp(Src) 97.7 F (36.5 C) (Oral)  Resp 18  Ht 5\' 4"  (1.626 m)  Wt 167 lb (75.751 kg)  BMI 28.65 kg/m2  SpO2 97%  LMP 08/14/2014 (Exact Date)      FHT:  FHR: 140 bpm, variability: moderate,  accelerations:  Present,  decelerations:  Absent UC:   regular, every 2 minutes SVE:   Dilation: 6.5 Effacement (%): 70, 80 Station: -3 Exam by:: Dr Clearance CootsHarper  Labs: Lab Results  Component Value Date   WBC 11.5* 04/29/2015   HGB 11.3* 04/29/2015   HCT 35.2* 04/29/2015   MCV 82.1 04/29/2015   PLT 161 04/29/2015    Assessment / Plan: Induction of labor due to gestational hypertension,  progressing well on pitocin  Labor: Progressing normally Preeclampsia:  on magnesium sulfate Fetal Wellbeing:  Category I Pain Control:  Nubain I/D:  n/a Anticipated MOD:  NSVD  HARPER,CHARLES A 04/29/2015, 11:34 PM

## 2015-04-30 ENCOUNTER — Encounter (HOSPITAL_COMMUNITY): Payer: Self-pay | Admitting: *Deleted

## 2015-04-30 LAB — CBC
HCT: 31.4 % — ABNORMAL LOW (ref 36.0–46.0)
Hemoglobin: 10.2 g/dL — ABNORMAL LOW (ref 12.0–15.0)
MCH: 26.5 pg (ref 26.0–34.0)
MCHC: 32.5 g/dL (ref 30.0–36.0)
MCV: 81.6 fL (ref 78.0–100.0)
PLATELETS: 157 10*3/uL (ref 150–400)
RBC: 3.85 MIL/uL — AB (ref 3.87–5.11)
RDW: 13.9 % (ref 11.5–15.5)
WBC: 13.6 10*3/uL — ABNORMAL HIGH (ref 4.0–10.5)

## 2015-04-30 LAB — RPR: RPR: NONREACTIVE

## 2015-04-30 MED ORDER — ONDANSETRON HCL 4 MG/2ML IJ SOLN: 4.0000 mg | INTRAMUSCULAR | Status: DC | PRN

## 2015-04-30 MED ORDER — OXYCODONE-ACETAMINOPHEN 5-325 MG PO TABS: 2.0000 | ORAL_TABLET | ORAL | Status: DC | PRN

## 2015-04-30 MED ORDER — PRENATAL MULTIVITAMIN CH
1.0000 | ORAL_TABLET | Freq: Every day | ORAL | Status: DC
  Administered 2015-04-30: 1 via ORAL
  Filled 2015-04-30: qty 1

## 2015-04-30 MED ORDER — ACETAMINOPHEN 325 MG PO TABS: 650.0000 mg | ORAL_TABLET | ORAL | Status: DC | PRN

## 2015-04-30 MED ORDER — WITCH HAZEL-GLYCERIN EX PADS: 1.0000 "application " | MEDICATED_PAD | CUTANEOUS | Status: DC | PRN

## 2015-04-30 MED ORDER — BENZOCAINE-MENTHOL 20-0.5 % EX AERO: 1.0000 "application " | INHALATION_SPRAY | CUTANEOUS | Status: DC | PRN

## 2015-04-30 MED ORDER — OXYTOCIN 40 UNITS IN LACTATED RINGERS INFUSION - SIMPLE MED: 62.5000 mL/h | INTRAVENOUS | Status: DC | PRN

## 2015-04-30 MED ORDER — LACTATED RINGERS IV SOLN
2.0000 g/h | INTRAVENOUS | Status: DC
  Administered 2015-04-30: 2 g/h via INTRAVENOUS
  Filled 2015-04-30: qty 80

## 2015-04-30 MED ORDER — OXYCODONE-ACETAMINOPHEN 5-325 MG PO TABS: 1.0000 | ORAL_TABLET | ORAL | Status: DC | PRN

## 2015-04-30 MED ORDER — DIBUCAINE 1 % RE OINT: 1.0000 "application " | TOPICAL_OINTMENT | RECTAL | Status: DC | PRN

## 2015-04-30 MED ORDER — DIPHENHYDRAMINE HCL 25 MG PO CAPS: 25.0000 mg | ORAL_CAPSULE | Freq: Four times a day (QID) | ORAL | Status: DC | PRN

## 2015-04-30 MED ORDER — IBUPROFEN 600 MG PO TABS
600.0000 mg | ORAL_TABLET | Freq: Four times a day (QID) | ORAL | Status: DC
  Administered 2015-04-30 – 2015-05-01 (×6): 600 mg via ORAL
  Filled 2015-04-30 (×6): qty 1

## 2015-04-30 MED ORDER — ONDANSETRON HCL 4 MG PO TABS: 4.0000 mg | ORAL_TABLET | ORAL | Status: DC | PRN

## 2015-04-30 MED ORDER — ZOLPIDEM TARTRATE 5 MG PO TABS: 5.0000 mg | ORAL_TABLET | Freq: Every evening | ORAL | Status: DC | PRN

## 2015-04-30 MED ORDER — LANOLIN HYDROUS EX OINT: TOPICAL_OINTMENT | CUTANEOUS | Status: DC | PRN

## 2015-04-30 MED ORDER — TETANUS-DIPHTH-ACELL PERTUSSIS 5-2.5-18.5 LF-MCG/0.5 IM SUSP
0.5000 mL | Freq: Once | INTRAMUSCULAR | Status: DC
  Filled 2015-04-30: qty 0.5

## 2015-04-30 MED ORDER — SENNOSIDES-DOCUSATE SODIUM 8.6-50 MG PO TABS
2.0000 | ORAL_TABLET | ORAL | Status: DC
  Administered 2015-04-30: 2 via ORAL
  Filled 2015-04-30: qty 2

## 2015-04-30 MED ORDER — SIMETHICONE 80 MG PO CHEW: 80.0000 mg | CHEWABLE_TABLET | ORAL | Status: DC | PRN

## 2015-04-30 MED ORDER — LACTATED RINGERS IV SOLN
INTRAVENOUS | Status: DC
Start: 2015-04-30 — End: 2015-05-01
  Administered 2015-04-30 – 2015-05-01 (×2): via INTRAVENOUS

## 2015-04-30 NOTE — Progress Notes (Signed)
Post Partum Day 1 Subjective: no complaints  Objective: Blood pressure 117/73, pulse 77, temperature 98.3 F (36.8 C), temperature source Oral, resp. rate 18, height 5\' 4"  (1.626 m), weight 167 lb (75.751 kg), last menstrual period 08/14/2014, SpO2 98 %, unknown if currently breastfeeding.  Physical Exam:  General: alert and no distress Lochia: appropriate Uterine Fundus: firm Incision: none DVT Evaluation: No evidence of DVT seen on physical exam.   Recent Labs  04/29/15 2320 04/30/15 0425  HGB 11.3* 10.2*  HCT 35.2* 31.4*    Assessment/Plan: Plan for discharge tomorrow   LOS: 1 day   HARPER,CHARLES A 04/30/2015, 12:20 PM

## 2015-04-30 NOTE — Progress Notes (Signed)
Patient began eating dinner at 2045 will check blood sugar at 2245

## 2015-04-30 NOTE — Lactation Note (Addendum)
This note was copied from the chart of Boy Therisa DoyneLaura Radwan. Lactation Consultation Note  Patient Name: Boy Therisa DoyneLaura Suell WUJWJ'XToday's Date: 04/30/2015 Reason for consult: Initial assessment;Infant < 6lbs;Late preterm infant   Initial consultation with mom of 4411 hour old 36w 6d infant who is in Antenatal on MgSO4. This is mom's 4th baby, she BF her children for 1 yr, 4 months and 6 months, one child was a 32 week infant in NICU for 1 month. Oldest child is 24 years old. Infant with 1 void, 1 smear of a stool, 3 BF attempts, ans 3 formula feedings of 15, 18, and 10 cc. Infant is 5 pounds 11.4 oz. Mom is alert and is holding the infant STS. Mom reports she has been attempting to place infant at breast and he is not latching. Mom with large, soft breasts with everted short shafted nipples. Mom is able to hand express large gtts of colostrum while attempting to place infant to breast. Infant with feeding cues, assisted mom in placing infant to left breast in cradle and football hold. Mom did well with stimulating infant and helping him latch on.  He rooted and placed nipple in mouth, would not latch, mom held him STS and he drifted off to sleep. Infant had taken an bottle at 0930 of 10 cc formula. Mom reported she would like for the baby to get BM for supplementation. Set up DEBP for mom, reviewed pump set up, cleaning, and milk storage. Reviewed LPTI policy and handout with appropriate supplementation volumes for infant per day of age. Handout left at bedside for mom to view. Mom pumped for 15 minutes, she received 9 cc on the left and 1 cc on the right, she then hand expressed an additional 5 cc form right breast. Dr. Luna FuseEttefagh was at bedside, updated on feeding, Dr. Weston BrassEttafagh wants infant supplemented with EBM or Similac Neocare 22 cal formula. Plan made with mom to attempt to feed infant every 3 hours at breast followed by supplementation on EBM/ Formula up to 10cc today, followed by 15 minutes pumping with DEBP,  followed by hand expression. Reviewed I/O ang gave mom feeding log and pencil.  Mom's RN was in room and updated on plan. [facility] given, mom informed of OP Services, IP services, Support groups, and BF Resources. [place] [facility] and plans to call them prior to D/C. Encouraged mom to call for assistance as needed.    Maternal Data Formula Feeding for Exclusion: No Has patient been taught Hand Expression?: Yes Does the patient have breastfeeding experience prior to this delivery?: Yes  Feeding Feeding Type: Breast Fed Length of feed: 0 min  LATCH Score/Interventions Latch: Too sleepy or reluctant, no latch achieved, no sucking elicited.  Audible Swallowing: None  Type of Nipple: Everted at rest and after stimulation  Comfort (Breast/Nipple): Soft / non-tender     Hold (Positioning): Assistance needed to correctly position infant at breast and maintain latch.  LATCH Score: 5  Lactation Tools Discussed/Used WIC Program: Yes [facility] [facility] Pump Review: Setup, frequency, and cleaning;Milk Storage Initiated by:: Noralee StainSharon Ira Busbin, RN, IBCLC Date initiated:: 04/30/15   Consult Status Consult Status: Follow-up Date: 05/01/15 Follow-up type: In-patient    Silas FloodSharon S Eyleen Rawlinson 04/30/2015, 11:27 AM

## 2015-05-01 ENCOUNTER — Ambulatory Visit: Payer: Self-pay

## 2015-05-01 MED ORDER — OXYCODONE-ACETAMINOPHEN 5-325 MG PO TABS: 2.0000 | ORAL_TABLET | ORAL | Status: DC | PRN

## 2015-05-01 MED ORDER — IBUPROFEN 600 MG PO TABS: 600.0000 mg | ORAL_TABLET | Freq: Four times a day (QID) | ORAL | Status: DC | PRN

## 2015-05-01 NOTE — Lactation Note (Signed)
This note was copied from the chart of Stephanie Stephanie [name]. Lactation Consultation Note  Patient Name: Stephanie [name] WGNFA'OToday's Date: 05/01/2015 Reason for consult: Follow-up assessment;Infant < 6lbs;Late preterm infant   Follow-up consult at 8743 hours old.  Mom was discharged from Antenatal and Baby is now [place]. Infant has attempted breastfeeding x1 (10 min attempt) + formula bottle x9 (10-20 ml) + EBM x3 (3-15 ml) in past 24 hours; voids-7 in 24 / 8 life; stools -5 in 24 hours/ 6 life.  No LS in 24 hours d/t only 1 attempt.   Mom reports pumping this morning and getting 20 ml transitional milk.  Mom has DEBP in room but reports she likes using HP better.  Reviewed with mom preemie setting on DEBP and turning dial up to 3-4 teardrops (mom was only using 1 teardrop) and discussed how to use hands-on pumping method to maximize milk output.   LC encouraged mom to pump at least 8 times per day during the day after breastfeeding infant for 10-20 minutes.   Educated on supply and demand and engorgement prevention.   Mom reports infant had just [facility] and reports hearing swallows.  Educated on LPTI behaviors and limiting feedings to 30 minutes.  Educated to awaken every 2.5 hours for feedings if infant is still sleeping.   Encouraged [facility] to [facility] or RN at next latch for a latch check.      Feeding Feeding Type: Breast Fed Length of feed: 10 min  LATCH Score/Interventions    Audible Swallowing: A few with stimulation (mom reports hearing swallows)     Consult Status Consult Status: Follow-up Date: 05/02/15 Follow-up type: In-patient    Lendon KaVann, Harlene Petralia Walker 05/01/2015, 7:01 PM

## 2015-05-01 NOTE — Discharge Summary (Signed)
Obstetric Discharge Summary Reason for Admission: induction of labor Prenatal Procedures: ultrasound Intrapartum Procedures: spontaneous vaginal delivery Postpartum Procedures: none Complications-Operative and Postpartum: none HEMOGLOBIN  Date Value Ref Range Status  04/30/2015 10.2* 12.0 - 15.0 g/dL Final   HCT  Date Value Ref Range Status  04/30/2015 31.4* 36.0 - 46.0 % Final    Physical Exam:  General: alert and no distress Lochia: appropriate Uterine Fundus: firm Incision: none DVT Evaluation: No evidence of DVT seen on physical exam.  Discharge Diagnoses: Term Pregnancy-delivered  Discharge Information: Date: 05/01/2015 Activity: pelvic rest Diet: routine Medications: PNV, Ibuprofen, Colace and Percocet Condition: stable Instructions: refer to practice specific booklet Discharge to: home Follow-up Information    Follow up with Stephanie [name], CNM. Schedule an appointment as soon as possible for [name] visit in 2 weeks.   Specialty:  Certified Nurse Midwife   Contact information:   [address] [place] [id number] [phone]       Newborn Data: Live born female  Birth Weight: 5 lb 12.2 oz (2614 g) APGAR: 8, 9  Home with mother.  Stephanie [name],Stephanie [name] 05/01/2015, 6:17 AM

## 2015-05-01 NOTE — Progress Notes (Signed)
Post Partum Day 1.5 Subjective: no complaints  Objective: Blood pressure 129/80, pulse 78, temperature 98.1 F (36.7 C), temperature source Oral, resp. rate 18, height 5\' 4"  (1.626 m), weight 167 lb (75.751 kg), last menstrual period 08/14/2014, SpO2 98 %, unknown if currently breastfeeding.  Physical Exam:  General: alert and no distress Lochia: appropriate Uterine Fundus: firm Incision: none DVT Evaluation: No evidence of DVT seen on physical exam.   Recent Labs  04/29/15 2320 04/30/15 0425  HGB 11.3* 10.2*  HCT 35.2* 31.4*    Assessment/Plan: Discharge home   LOS: 2 days   HARPER,CHARLES A 05/01/2015, 6:14 AM

## 2015-05-02 ENCOUNTER — Other Ambulatory Visit (HOSPITAL_COMMUNITY): Payer: Self-pay | Admitting: *Deleted

## 2015-05-02 ENCOUNTER — Ambulatory Visit: Payer: Self-pay

## 2015-05-02 DIAGNOSIS — O365930 Maternal care for other known or suspected poor fetal growth, third trimester, not applicable or unspecified: Secondary | ICD-10-CM

## 2015-05-02 NOTE — Lactation Note (Signed)
This note was copied from the chart of Stephanie Stephanie [name]. Lactation Consultation Note: Experienced BF mom reports baby has been nursing better, Just finished nursing for 15 min- asleep in mom's arms at present. Mom ready for DC. Mature milk in and mom reports she hears swallows and breast gets softer. No questions at present. Reviewed BFSG and OP appointments as resources for support after DC. To call prn  Patient Name: Stephanie [name] ZOXWR'UToday's Date: 05/02/2015 Reason for consult: Follow-up assessment;Infant < 6lbs;Late preterm infant   Maternal Data Formula Feeding for Exclusion: Yes Does the patient have breastfeeding experience prior to this delivery?: Yes  Feeding Feeding Type: Breast Fed Length of feed: 15 min  LATCH Score/Interventions                      Lactation Tools Discussed/Used WIC Program: Yes   Consult Status Consult Status: Complete    Pamelia HoitWeeks, Stephanie [name] D 05/02/2015, 10:13 AM

## 2015-05-03 ENCOUNTER — Ambulatory Visit (HOSPITAL_COMMUNITY): Payer: Medicaid Other

## 2015-05-03 ENCOUNTER — Other Ambulatory Visit (HOSPITAL_COMMUNITY): Payer: Medicaid Other

## 2015-05-03 LAB — TYPE AND SCREEN
ABO/RH(D): O POS
ANTIBODY SCREEN: NEGATIVE
UNIT DIVISION: 0
Unit division: 0

## 2015-05-10 ENCOUNTER — Ambulatory Visit (HOSPITAL_COMMUNITY): Payer: Medicaid Other

## 2015-06-01 ENCOUNTER — Emergency Department (HOSPITAL_COMMUNITY): Payer: Medicaid Other

## 2015-06-01 ENCOUNTER — Encounter (HOSPITAL_COMMUNITY): Payer: Self-pay | Admitting: Emergency Medicine

## 2015-06-01 ENCOUNTER — Emergency Department (HOSPITAL_COMMUNITY)
Admission: EM | Admit: 2015-06-01 | Discharge: 2015-06-01 | Disposition: A | Discharge status: Home / Self Care | Payer: Medicaid Other | Attending: Emergency Medicine | Admitting: Emergency Medicine

## 2015-06-01 DIAGNOSIS — Z87440 Personal history of urinary (tract) infections: Secondary | ICD-10-CM | POA: Diagnosis not present

## 2015-06-01 DIAGNOSIS — R55 Syncope and collapse: Secondary | ICD-10-CM | POA: Diagnosis not present

## 2015-06-01 DIAGNOSIS — R51 Headache: Secondary | ICD-10-CM | POA: Insufficient documentation

## 2015-06-01 DIAGNOSIS — O9089 Other complications of the puerperium, not elsewhere classified: Secondary | ICD-10-CM | POA: Diagnosis present

## 2015-06-01 DIAGNOSIS — R519 Headache, unspecified: Secondary | ICD-10-CM

## 2015-06-01 DIAGNOSIS — I10 Essential (primary) hypertension: Secondary | ICD-10-CM | POA: Diagnosis not present

## 2015-06-01 LAB — CBC
HCT: 38.2 % (ref 36.0–46.0)
Hemoglobin: 12 g/dL (ref 12.0–15.0)
MCH: 25.4 pg — ABNORMAL LOW (ref 26.0–34.0)
MCHC: 31.4 g/dL (ref 30.0–36.0)
MCV: 80.8 fL (ref 78.0–100.0)
PLATELETS: 264 10*3/uL (ref 150–400)
RBC: 4.73 MIL/uL (ref 3.87–5.11)
RDW: 14.5 % (ref 11.5–15.5)
WBC: 5.3 10*3/uL (ref 4.0–10.5)

## 2015-06-01 LAB — BASIC METABOLIC PANEL
Anion gap: 7 (ref 5–15)
BUN: 14 mg/dL (ref 6–20)
CALCIUM: 9.2 mg/dL (ref 8.9–10.3)
CO2: 25 mmol/L (ref 22–32)
Chloride: 110 mmol/L (ref 101–111)
Creatinine, Ser: 0.55 mg/dL (ref 0.44–1.00)
GFR calc Af Amer: >60 mL/min (ref >60)
GLUCOSE: 85 mg/dL (ref 65–99)
Potassium: 3.8 mmol/L (ref 3.5–5.1)
Sodium: 142 mmol/L (ref 135–145)

## 2015-06-01 MED ORDER — METOCLOPRAMIDE HCL 5 MG/ML IJ SOLN
10.0000 mg | Freq: Once | INTRAMUSCULAR | Status: AC
  Administered 2015-06-01: 10 mg via INTRAVENOUS
  Filled 2015-06-01: qty 2

## 2015-06-01 MED ORDER — DIPHENHYDRAMINE HCL 50 MG/ML IJ SOLN
25.0000 mg | Freq: Once | INTRAMUSCULAR | Status: AC
  Administered 2015-06-01: 25 mg via INTRAVENOUS
  Filled 2015-06-01: qty 1

## 2015-06-01 MED ORDER — ACETAMINOPHEN 325 MG PO TABS: 325.0000 mg | ORAL_TABLET | Freq: Four times a day (QID) | ORAL | Status: DC | PRN

## 2015-06-01 MED ORDER — ACETAMINOPHEN 325 MG PO TABS: 650.0000 mg | ORAL_TABLET | Freq: Four times a day (QID) | ORAL | Status: DC | PRN

## 2015-06-01 MED ORDER — SODIUM CHLORIDE 0.9 % IV BOLUS (SEPSIS)
1000.0000 mL | Freq: Once | INTRAVENOUS | Status: AC
  Administered 2015-06-01: 1000 mL via INTRAVENOUS

## 2015-06-01 NOTE — Discharge Instructions (Signed)
Ms. Marianne SofiaDelhoyo and Family,  Nice meeting you! You may take Tylenol for your headache. Return to the emergency department if you pass out again, have a worsening headache, or feel confused, weak, or unable to walk. Please follow-up with a primary care provider [facility] information attached if you are interested) within one week. Feel better soon!  S. Lane HackerNicole Deneisha Dade, PA-C

## 2015-06-01 NOTE — ED Provider Notes (Signed)
CSN: [id number]     Arrival date & time 06/01/15  1800 History   First MD Initiated Contact with Patient 06/01/15 1836     Chief Complaint  Patient presents with  . Headache  . Loss of Consciousness    HPI   Stephanie [name] is a 24 y.o. postpartum F PMH significant for HTN presenting with a headache since 11 am this morning. She describes her headache as 5/10 pain scale, right-sided, constant, achy, non-radiating. She endorses loss of consciousness (approximately 20 minutes while laying down), right eye pain (behind her eye), a history of migraines as a child. She denies fevers, chills, visual changes, CP, SOB, N/V, loss of bladder/bowel control, alleviation attempts or exacerbating factors.   Past Medical History  Diagnosis Date  . UTI (urinary tract infection)   . Hypertension    Past Surgical History  Procedure Laterality Date  . No past surgeries     Family History  Problem Relation Age of Onset  . Diabetes Father   . Hypertension Father   . Hyperlipidemia Father    Social History  Substance Use Topics  . Smoking status: Never Smoker   . Smokeless tobacco: Never Used  . Alcohol Use: No   OB History    Gravida Para Term Preterm AB TAB SAB Ectopic Multiple Living   4 4 2 2  0 0 0 0 0 4     Review of Systems  Ten systems are reviewed and are negative for acute change except as noted in the HPI  Allergies  Review of patient's allergies indicates no known allergies.  Home Medications   Prior to Admission medications   Medication Sig Start Date End Date Taking? Authorizing Provider  acetaminophen (TYLENOL) 325 MG tablet Take 650 mg by mouth every 6 (six) hours as needed for mild pain or headache.    Historical Provider, MD  calcium carbonate (TUMS - DOSED IN MG ELEMENTAL CALCIUM) 500 MG chewable tablet Chew 1 tablet by mouth daily as needed for indigestion or heartburn.    Historical Provider, MD  ibuprofen (ADVIL,MOTRIN) 600 MG tablet Take 1 tablet (600 mg total) by  mouth every 6 (six) hours as needed for mild pain. Patient not taking: Reported on 06/01/2015 05/01/15   Brock Badharles A Harper, MD  oxyCODONE-acetaminophen (PERCOCET/ROXICET) 5-325 MG tablet Take 2 tablets by mouth every 4 (four) hours as needed for moderate pain or severe pain (for pain scale greater than 7). Patient not taking: Reported on 06/01/2015 05/01/15   Brock Badharles A Harper, MD  ranitidine (ZANTAC) 150 MG tablet Take 1 tablet (150 mg total) by mouth 2 (two) times daily. Patient not taking: Reported on 06/01/2015 04/15/15   Brock Badharles A Harper, MD   BP 124/74 mmHg  Pulse 67  Temp(Src) 98.2 F (36.8 C) (Oral)  Resp 18  Ht 5\' 3"  (1.6 m)  Wt 66.724 kg  BMI 26.06 kg/m2  SpO2 99% Physical Exam  Constitutional: She is oriented to person, place, and time. She appears well-developed and well-nourished. No distress.  HENT:  Head: Normocephalic and atraumatic.  Right Ear: External ear normal.  Left Ear: External ear normal.  Nose: Nose normal.  Mouth/Throat: Oropharynx is clear and moist. No oropharyngeal exudate.  Eyes: Conjunctivae and EOM are normal. Pupils are equal, round, and reactive to light. Right eye exhibits no discharge. Left eye exhibits no discharge. No scleral icterus.  Neck: Normal range of motion. Neck supple. No tracheal deviation present.  Cardiovascular: Normal rate, regular rhythm, normal heart sounds and  intact distal pulses.  Exam reveals no gallop and no friction rub.   No murmur heard. Pulmonary/Chest: Effort normal and breath sounds normal. No respiratory distress. She has no wheezes. She has no rales. She exhibits no tenderness.  Abdominal: Soft. Bowel sounds are normal. She exhibits no distension and no mass. There is no tenderness. There is no rebound and no guarding.  Musculoskeletal: Normal range of motion. She exhibits no edema or tenderness.  Lymphadenopathy:    She has no cervical adenopathy.  Neurological: She is alert and oriented to person, place, and time. No  cranial nerve deficit. Coordination normal.  Skin: Skin is warm and dry. No rash noted. She is not diaphoretic. No erythema.  Psychiatric: She has a normal mood and affect. Her behavior is normal.  Nursing note and vitals reviewed.   ED Course  Procedures  Labs Review Labs Reviewed  CBC - Abnormal; Notable for the following:    MCH 25.4 (*)    All other components within normal limits  BASIC METABOLIC PANEL  URINALYSIS, ROUTINE W REFLEX MICROSCOPIC (NOT AT [facility])  CBG MONITORING, ED    Imaging Review Ct Head Wo Contrast  06/01/2015  CLINICAL DATA:  One day history of headache EXAM: CT HEAD WITHOUT CONTRAST TECHNIQUE: Contiguous axial images were obtained from the base of the skull through the vertex without intravenous contrast. COMPARISON:  None. FINDINGS: The ventricles are normal in size and configuration. There is no intracranial mass, hemorrhage, extra-axial fluid collection, or midline shift. The gray-white compartments are normal. No acute infarct evident. The bony calvarium appears intact. The mastoid air cells are clear. IMPRESSION: Study within normal limits. Electronically Signed   By: Bretta Bang III M.D.   On: 06/01/2015 20:28   Mr Brain Wo Contrast  06/01/2015  CLINICAL DATA:  Initial evaluation for acute headache, post partum, syncope. EXAM: MRI HEAD WITHOUT CONTRAST MRV HEAD WITHOUT CONTRAST TECHNIQUE: Multiplanar, multiecho pulse sequences of the brain and surrounding structures were obtained without intravenous contrast. Angiographic images of the intracranial venous structures were obtained using MRV technique without intravenous contrast. COMPARISON:  Prior CT from earlier the same day. FINDINGS: The CSF containing spaces are within normal limits for patient age. No focal parenchymal signal abnormality is identified. No mass lesion, midline shift, or extra-axial fluid collection. Ventricles are normal in size without evidence of hydrocephalus. No diffusion-weighted  signal abnormality is identified to suggest acute intracranial infarct. Gray-white matter differentiation is maintained. Normal flow voids are seen within the intracranial vasculature. No intracranial hemorrhage identified. The cervicomedullary junction is normal. Pituitary gland is within normal limits. Pituitary stalk is midline. The globes and optic nerves demonstrate a normal appearance with normal signal intensity. The bone marrow signal intensity is normal. Calvarium is intact. Visualized upper cervical spine is within normal limits. Scalp soft tissues are unremarkable. Paranasal sinuses are clear.  No mastoid effusion. Dedicated MRV images demonstrate no filling defect to suggest venous sinus thrombosis. The right transverse and sigmoid sinuses are diffusely hypoplastic as compared to the left. The straight sinus, vein of Galen, and internal cerebral veins are patent. Inferior sagittal sinus appears patent. IMPRESSION: 1. Normal brain MRI. 2. Negative MRV with no evidence for venous sinus thrombosis. Electronically Signed   By: Rise Mu M.D.   On: 06/01/2015 22:13   Mr Mrv Head Wo Cm  06/01/2015  CLINICAL DATA:  Initial evaluation for acute headache, post partum, syncope. EXAM: MRI HEAD WITHOUT CONTRAST MRV HEAD WITHOUT CONTRAST TECHNIQUE: Multiplanar, multiecho pulse sequences  of the brain and surrounding structures were obtained without intravenous contrast. Angiographic images of the intracranial venous structures were obtained using MRV technique without intravenous contrast. COMPARISON:  Prior CT from earlier the same day. FINDINGS: The CSF containing spaces are within normal limits for patient age. No focal parenchymal signal abnormality is identified. No mass lesion, midline shift, or extra-axial fluid collection. Ventricles are normal in size without evidence of hydrocephalus. No diffusion-weighted signal abnormality is identified to suggest acute intracranial infarct. Gray-white  matter differentiation is maintained. Normal flow voids are seen within the intracranial vasculature. No intracranial hemorrhage identified. The cervicomedullary junction is normal. Pituitary gland is within normal limits. Pituitary stalk is midline. The globes and optic nerves demonstrate a normal appearance with normal signal intensity. The bone marrow signal intensity is normal. Calvarium is intact. Visualized upper cervical spine is within normal limits. Scalp soft tissues are unremarkable. Paranasal sinuses are clear.  No mastoid effusion. Dedicated MRV images demonstrate no filling defect to suggest venous sinus thrombosis. The right transverse and sigmoid sinuses are diffusely hypoplastic as compared to the left. The straight sinus, vein of Galen, and internal cerebral veins are patent. Inferior sagittal sinus appears patent. IMPRESSION: 1. Normal brain MRI. 2. Negative MRV with no evidence for venous sinus thrombosis. Electronically Signed   By: Rise Mu M.D.   On: 06/01/2015 22:13   I have personally reviewed and evaluated these images and lab results as part of my medical decision-making.   EKG Interpretation   Date/Time:  Wednesday June 01 2015 18:08:20 EST Ventricular Rate:  75 PR Interval:  124 QRS Duration: 74 QT Interval:  370 QTC Calculation: 413 R Axis:   83 Text Interpretation:  Normal sinus rhythm Normal ECG ED PHYSICIAN  INTERPRETATION AVAILABLE IN [facility] Confirmed by TEST, Record  ([id number]) on 06/02/2015 10:10:56 AM      MDM   Final diagnoses:  Postpartum headache  Patient non-toxic appearing and VSS. Based on patient history and physical exam, most likely etiologies are primary headache, venous sinus thrombosis (postpartum), sinusitis. Less likely etiologies are hypertensive HA, aneurysmal SAH, AVM, acute CVA, carotid dissection, traumatic ICH, SAH, SDH, EDH, postconcussive syndrome, meningitis, encephalitis, abscess, mass effect,  hydrocephalus,  pseudotumor cerebri, glaucoma, trigeminal neuralgia, TMJ syndrome, temporal arteritis, mastoiditis, carbon monoxide poisoning, rebound HA.  Patient will switch to formula over the next 2 days in case of breastmilk excretion for headache cocktail.   Patient feeling better after headache cocktail. Basic labs and EKG unremarkable. Negative head CT. Will order MR head without and MRV to further evaluate for venous sinus thrombosis.   Negative MR/MRV studies. Patient may be safely discharged home. Discussed reasons for return. Patient to follow-up with primary care provider within one week. Patient in understanding and agreement with the plan.   Melton Krebs, PA-C 06/16/15 1353  Leta Baptist, MD 06/24/15 [phone]

## 2015-06-01 NOTE — ED Notes (Signed)
Pt reports headache 11am. Hx of migraines as child. Pt also reports that she had a syncopal episode today while laying down lasting approx 20 mins. C/o pain to R eye.

## 2015-06-01 NOTE — ED Notes (Addendum)
Patient verbalized understanding of discharge instructions and denies any further needs or questions at this time. VS stable. 

## 2015-06-21 ENCOUNTER — Ambulatory Visit (INDEPENDENT_AMBULATORY_CARE_PROVIDER_SITE_OTHER): Payer: Medicaid Other | Admitting: Certified Nurse Midwife

## 2015-06-21 ENCOUNTER — Encounter: Payer: Self-pay | Admitting: Certified Nurse Midwife

## 2015-06-21 VITALS — BP 126/85 | HR 70 | Temp 97.8°F | Wt 146.0 lb

## 2015-06-21 DIAGNOSIS — Z01419 Encounter for gynecological examination (general) (routine) without abnormal findings: Secondary | ICD-10-CM

## 2015-06-21 NOTE — Progress Notes (Signed)
Patient ID: Stephanie DoyneLaura Neer, female   DOB: 09/06/1990, 24 y.o.   MRN: [id number]  Subjective:     Stephanie DoyneLaura Folden is a 24 y.o. female who presents for a postpartum visit. She is 6 weeks postpartum following a spontaneous vaginal delivery. I have fully reviewed the prenatal and intrapartum course. The delivery was at 39 gestational weeks. Outcome: spontaneous vaginal delivery. Anesthesia: none. Postpartum course has been normal. Baby's course has been normal. Baby is feeding by both breast and bottle - Similac Advance. Bleeding no bleeding. Bowel function is normal. Bladder function is normal. Patient is sexually active. Contraception method is none. Postpartum depression screening: negative.  Tobacco, alcohol and substance abuse history reviewed.  Adult immunizations reviewed including TDAP, rubella and varicella.  The following portions of the patient's history were reviewed and updated as appropriate: allergies, current medications, past family history, past medical history, past social history, past surgical history and problem list.  Review of Systems Pertinent items noted in HPI and remainder of comprehensive ROS otherwise negative.   Objective:    BP 126/85 mmHg  Pulse 70  Temp(Src) 97.8 F (36.6 C)  Wt 146 lb (66.225 kg)  Breastfeeding? Yes  General:  alert, cooperative and no distress   Breasts:  inspection negative, no nipple discharge or bleeding, no masses or nodularity palpable  Lungs: clear to auscultation bilaterally  Heart:  regular rate and rhythm, S1, S2 normal, no murmur, click, rub or gallop  Abdomen: soft, non-tender; bowel sounds normal; no masses,  no organomegaly   Vulva:  normal  Vagina: normal vagina, no discharge, exudate, lesion, or erythema  Cervix:  no bleeding following Pap  Corpus: normal size, contour, position, consistency, mobility, non-tender  Adnexa:  normal adnexa  Rectal Exam: Not performed.          50% of 30 min visit spent on counseling and  coordination of care.  Assessment:     Normal 6 week postpartum exam. Pap smear done at today's visit.    Contraception management Plan:    1. Contraception: condoms 2. Planning Kyleena IUD 3. Follow up in: 3 weeks or as needed.  2hr GTT for h/o GDM/screening for DM q 3 yrs per ADA recommendations Preconception counseling provided Healthy lifestyle practices reviewed

## 2015-06-23 LAB — PAP IG W/ RFLX HPV ASCU

## 2015-06-24 LAB — HUMAN PAPILLOMAVIRUS, HIGH RISK: HPV DNA HIGH RISK: DETECTED — AB

## 2015-07-26 ENCOUNTER — Ambulatory Visit (INDEPENDENT_AMBULATORY_CARE_PROVIDER_SITE_OTHER): Payer: Medicaid Other | Admitting: Obstetrics

## 2015-07-26 ENCOUNTER — Encounter: Payer: Self-pay | Admitting: Obstetrics

## 2015-07-26 ENCOUNTER — Other Ambulatory Visit: Payer: Self-pay | Admitting: Obstetrics

## 2015-07-26 VITALS — Wt 145.0 lb

## 2015-07-26 DIAGNOSIS — R87611 Atypical squamous cells cannot exclude high grade squamous intraepithelial lesion on cytologic smear of cervix (ASC-H): Secondary | ICD-10-CM

## 2015-07-26 DIAGNOSIS — Z01818 Encounter for other preprocedural examination: Secondary | ICD-10-CM

## 2015-07-26 DIAGNOSIS — IMO0002 Reserved for concepts with insufficient information to code with codable children: Secondary | ICD-10-CM

## 2015-07-26 NOTE — Progress Notes (Signed)
Colposcopy Procedure Note  Indications: Pap smear 1 months ago showed: ASC cannot exclude high grade lesion [facility]). 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  Prior cervical/vaginal disease: none. Prior cervical treatment: no treatment.  Procedure Details  The risks and benefits of the procedure and Written informed consent obtained.  A time-out was performed confirming the patient, procedure and allergy status  Speculum placed in vagina and excellent visualization of cervix achieved, cervix swabbed x 3 with acetic acid solution.  Findings: Cervix: no mosaicism, no punctation, no abnormal vasculature and acetowhite lesion(s) noted at 6,12 and 1 o'clock; SCJ visualized 360 degrees without lesions, endocervical curettage performed, cervical biopsies taken at 6, 12  o'clock, specimen labelled and sent to pathology and hemostasis achieved with silver nitrate.   Vaginal inspection: normal without visible lesions. Vulvar colposcopy: vulvar colposcopy not performed.   Physical Exam   Specimens: ECC and cervical biopsies  Complications: none.  Plan: Specimens labelled and sent to Pathology. Will base further treatment on Pathology findings. Post biopsy instructions given to patient. Return to discuss Pathology results in 2 weeks.

## 2015-07-26 NOTE — Progress Notes (Signed)
Pt unable to void and leave urine sample for UPT before procedure, provider aware.

## 2015-07-30 LAB — SURESWAB, VAGINOSIS/VAGINITIS PLUS
ATOPOBIUM VAGINAE: NOT DETECTED Log (cells/mL)
C. ALBICANS, DNA: NOT DETECTED
C. GLABRATA, DNA: NOT DETECTED
C. PARAPSILOSIS, DNA: NOT DETECTED
C. TRACHOMATIS RNA, TMA: NOT DETECTED
C. TROPICALIS, DNA: NOT DETECTED
Gardnerella vaginalis: NOT DETECTED Log (cells/mL)
LACTOBACILLUS SPECIES: NOT DETECTED Log (cells/mL)
MEGASPHAERA SPECIES: NOT DETECTED Log (cells/mL)
N. gonorrhoeae RNA, TMA: NOT DETECTED
T. VAGINALIS RNA, QL TMA: NOT DETECTED

## 2015-08-09 ENCOUNTER — Ambulatory Visit (INDEPENDENT_AMBULATORY_CARE_PROVIDER_SITE_OTHER): Payer: Medicaid Other | Admitting: Obstetrics

## 2015-08-09 ENCOUNTER — Ambulatory Visit: Payer: Medicaid Other | Admitting: Obstetrics

## 2015-08-09 ENCOUNTER — Encounter: Payer: Self-pay | Admitting: Obstetrics

## 2015-08-09 VITALS — BP 127/86 | HR 74 | Temp 98.1°F | Wt 144.0 lb

## 2015-08-09 DIAGNOSIS — R896 Abnormal cytological findings in specimens from other organs, systems and tissues: Secondary | ICD-10-CM

## 2015-08-09 DIAGNOSIS — IMO0002 Reserved for concepts with insufficient information to code with codable children: Secondary | ICD-10-CM

## 2015-08-09 DIAGNOSIS — Z3009 Encounter for other general counseling and advice on contraception: Secondary | ICD-10-CM | POA: Diagnosis not present

## 2015-08-09 MED ORDER — NORETHINDRONE-ETH ESTRADIOL 1-35 MG-MCG PO TABS: 1.0000 | ORAL_TABLET | Freq: Every day | ORAL | Status: DC

## 2015-08-09 NOTE — Progress Notes (Signed)
Patient ID: Stephanie [name], female   DOB: [birth year], 25 y.o.   MRN: [id number]  Chief Complaint  Patient presents with  . Follow-up    results from colpo    HPI Stephanie [name] is a 25 y.o. female.  Presents for colpo results and management recommendations.  HPI  Past Medical History  Diagnosis Date  . UTI (urinary tract infection)   . Hypertension     Past Surgical History  Procedure Laterality Date  . No past surgeries      Family History  Problem Relation Age of Onset  . Diabetes Father   . Hypertension Father   . Hyperlipidemia Father     Social History Social History  Substance Use Topics  . Smoking status: Never Smoker   . Smokeless tobacco: Never Used  . Alcohol Use: No    No Known Allergies  Current Outpatient Prescriptions  Medication Sig Dispense Refill  . acetaminophen (TYLENOL) 325 MG tablet Take 1 tablet (325 mg total) by mouth every 6 (six) hours as needed for mild pain or headache. (Patient not taking: Reported on 06/21/2015) 30 tablet 0  . ibuprofen (ADVIL,MOTRIN) 600 MG tablet Take 1 tablet (600 mg total) by mouth every 6 (six) hours as needed for mild pain. (Patient not taking: Reported on 06/01/2015) 30 tablet 5  . norethindrone-ethinyl estradiol 1/35 (ORTHO-NOVUM, NORTREL,CYCLAFEM) tablet Take 1 tablet by mouth daily. 1 Package 11  . ranitidine (ZANTAC) 150 MG tablet Take 1 tablet (150 mg total) by mouth 2 (two) times daily. (Patient not taking: Reported on 06/01/2015) 60 tablet 5   No current facility-administered medications for this visit.    Review of Systems Review of Systems Constitutional: negative for fatigue and weight loss Respiratory: negative for cough and wheezing Cardiovascular: negative for chest pain, fatigue and palpitations Gastrointestinal: negative for abdominal pain and change in bowel habits Genitourinary:negative Integument/breast: negative for nipple discharge Musculoskeletal:negative for myalgias Neurological:  negative for gait problems and tremors Behavioral/Psych: negative for abusive relationship, depression Endocrine: negative for temperature intolerance     Blood pressure 127/86, pulse 74, temperature 98.1 F (36.7 C), weight 144 lb (65.318 kg), last menstrual period 07/21/2015, currently breastfeeding.  Physical Exam Physical ExamDeferred  100% of 10 min visit spent on counseling and coordination of care.   Data Reviewed Colposcopic biopsies  Assessment     HGSIL     Plan    LEEP   No orders of the defined types were placed in this encounter.   Meds ordered this encounter  Medications  . norethindrone-ethinyl estradiol 1/35 (ORTHO-NOVUM, NORTREL,CYCLAFEM) tablet    Sig: Take 1 tablet by mouth daily.    Dispense:  1 Package    Refill:  11

## 2015-08-09 NOTE — Patient Instructions (Addendum)
Loop Electrosurgical Excision Procedure Loop electrosurgical excision procedure (LEEP) is the removal of a portion of the lower part of the uterus (cervix). The procedure is done when there are significantly abnormal cervical cell changes. Abnormal cell changes of the cervix can lead to cancer if left in place and untreated.  The LEEP procedure itself typically only takes a few minutes. Often, it may be done in your caregiver's office. The procedure is considered safe for those who wish to get pregnant or are trying to get pregnant. Only under rare circumstances should this procedure be done if you are pregnant. LET YOUR CAREGIVER KNOW ABOUT:  Whether you are pregnant or late for your last menstrual period.  Allergies to foods or medicines.  All the medicines you are taking includingherbs, eyedrops, and over-the-counter medicines, and creams.  Use of steroids (by mouth or creams).  Previous problems with anesthetics or numbing medicine.  Previous gynecological surgery.  History of blood clots or bleeding problems.  Any recent or current vaginal infections (herpes, sexually transmitted infections).  Other health problems. RISKS AND COMPLICATIONS  Bleeding.  Infection.  Injury to the vagina, bladder, or rectum.  Very rare obstruction of the cervical opening that causes problems during menstruation (cervical stenosis). BEFORE THE PROCEDURE  Do not take aspirin or blood thinners (anticoagulants) for 1 week before the procedure, or as told by your caregiver.  Eat a light meal before the procedure.  Ask your caregiver about changing or stopping your regular medicines.  You may be given a pain reliever 1 or 2 hours before the procedure. PROCEDURE   A tool (speculum) is placed in the vagina. This allows your caregiver to see the cervix.  An iodine stain is applied to the cervix to find the area of abnormal cells to be removed.  Medicine is injected to numb the cervix (local  anesthetic).   Electricity is passed through a thin wire loop which is then used to remove (cauterize) a small segment of the affected cervix.  Light electrocautery is used to seal any small blood vessels and prevent bleeding.  A paste may be applied to the cauterized area of the cervix to help prevent bleeding.  The tissue sample is sent to the lab. It is examined under the microscope. AFTER THE PROCEDURE  Have someone drive you home.  You may have slight to moderate cramping.  You may notice a black vaginal discharge from the paste used on the cervix to prevent bleeding. This is normal.  Watch for excessive bleeding. This requires immediate medical care.  Ask when your test results will be ready. Make sure you get your test results.   This information is not intended to replace advice given to you by your health care provider. Make sure you discuss any questions you have with your health care provider.   Document Released: 09/15/2002 Document Revised: 09/17/2011 Document Reviewed: 12/05/2010 Elsevier Interactive Patient Education 2016 [employer] Loop Electrosurgical Excision Procedure, Care After Refer to this sheet in the next few weeks. These instructions provide you with information on caring for yourself after your procedure. Your caregiver may also give you more specific instructions. Your treatment has been planned according to current medical practices, but problems sometimes occur. Call your caregiver if you have any problems or questions after your procedure. HOME CARE INSTRUCTIONS   Do not use tampons, douche, or have sexual intercourse for 2 weeks or as directed by your caregiver.  Begin normal activities if you have no or minimal cramping  or bleeding, unless directed otherwise by your caregiver.  Take your temperature if you feel sick. Write down your temperature on paper, and tell your caregiver if you have a fever.  Take all medicines as directed by your  caregiver.  Keep all your follow-up appointments and Pap tests as directed by your caregiver. SEEK IMMEDIATE MEDICAL CARE IF:   You have bleeding that is heavier or longer than a normal menstrual cycle.  You have bleeding that is bright red.  You have blood clots.  You have a fever.  You have increasing cramps or pain not relieved by medicine.  You develop abdominal pain that does not seem to be related to the same area of earlier cramping and pain.  You are lightheaded, unusually weak, or faint.  You develop painful or bloody urination.  You develop a bad smelling vaginal discharge. MAKE SURE YOU:  Understand these instructions.  Will watch your condition.  Will get help right away if you are not doing well or get worse.   This information is not intended to replace advice given to you by your health care provider. Make sure you discuss any questions you have with your health care provider.   Document Released: 03/08/2011 Document Revised: 07/16/2014 Document Reviewed: 03/08/2011 Elsevier Interactive Patient Education 2016 [employer] Loop Electrosurgical Excision Procedure, Care After Refer to this sheet in the next few weeks. These instructions provide you with information on caring for yourself after your procedure. Your caregiver may also give you more specific instructions. Your treatment has been planned according to current medical practices, but problems sometimes occur. Call your caregiver if you have any problems or questions after your procedure. HOME CARE INSTRUCTIONS   Do not use tampons, douche, or have sexual intercourse for 2 weeks or as directed by your caregiver.  Begin normal activities if you have no or minimal cramping or bleeding, unless directed otherwise by your caregiver.  Take your temperature if you feel sick. Write down your temperature on paper, and tell your caregiver if you have a fever.  Take all medicines as directed by your  caregiver.  Keep all your follow-up appointments and Pap tests as directed by your caregiver. SEEK IMMEDIATE MEDICAL CARE IF:   You have bleeding that is heavier or longer than a normal menstrual cycle.  You have bleeding that is bright red.  You have blood clots.  You have a fever.  You have increasing cramps or pain not relieved by medicine.  You develop abdominal pain that does not seem to be related to the same area of earlier cramping and pain.  You are lightheaded, unusually weak, or faint.  You develop painful or bloody urination.  You develop a bad smelling vaginal discharge. MAKE SURE YOU:  Understand these instructions.  Will watch your condition.  Will get help right away if you are not doing well or get worse.   This information is not intended to replace advice given to you by your health care provider. Make sure you discuss any questions you have with your health care provider.   Document Released: 03/08/2011 Document Revised: 07/16/2014 Document Reviewed: 03/08/2011 Elsevier Interactive Patient Education [employer]

## 2015-09-07 ENCOUNTER — Ambulatory Visit (INDEPENDENT_AMBULATORY_CARE_PROVIDER_SITE_OTHER): Payer: Medicaid Other | Admitting: Obstetrics

## 2015-09-07 DIAGNOSIS — R896 Abnormal cytological findings in specimens from other organs, systems and tissues: Secondary | ICD-10-CM

## 2015-09-07 DIAGNOSIS — IMO0002 Reserved for concepts with insufficient information to code with codable children: Secondary | ICD-10-CM

## 2015-09-07 DIAGNOSIS — Z30011 Encounter for initial prescription of contraceptive pills: Secondary | ICD-10-CM

## 2015-09-07 MED ORDER — NORETHINDRONE 0.35 MG PO TABS: 1.0000 | ORAL_TABLET | Freq: Every day | ORAL | Status: DC

## 2015-09-07 NOTE — Progress Notes (Signed)
Patient ID: Stephanie [name], female   DOB: 04-01-1991, 25 y.o.   MRN: [id number]  No chief complaint on file.   HPI Stephanie [name] is a 25 y.o. female.  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  HPI  Past Medical History  Diagnosis Date  . UTI (urinary tract infection)   . Hypertension     Past Surgical History  Procedure Laterality Date  . No past surgeries      Family History  Problem Relation Age of Onset  . Diabetes Father   . Hypertension Father   . Hyperlipidemia Father     Social History Social History  Substance Use Topics  . Smoking status: Never Smoker   . Smokeless tobacco: Never Used  . Alcohol Use: No    No Known Allergies  Current Outpatient Prescriptions  Medication Sig Dispense Refill  . acetaminophen (TYLENOL) 325 MG tablet Take 1 tablet (325 mg total) by mouth every 6 (six) hours as needed for mild pain or headache. (Patient not taking: Reported on 06/21/2015) 30 tablet 0  . ibuprofen (ADVIL,MOTRIN) 600 MG tablet Take 1 tablet (600 mg total) by mouth every 6 (six) hours as needed for mild pain. (Patient not taking: Reported on 06/01/2015) 30 tablet 5  . norethindrone (MICRONOR,CAMILA,ERRIN) 0.35 MG tablet Take 1 tablet (0.35 mg total) by mouth daily. 1 Package 11  . ranitidine (ZANTAC) 150 MG tablet Take 1 tablet (150 mg total) by mouth 2 (two) times daily. (Patient not taking: Reported on 06/01/2015) 60 tablet 5   No current facility-administered medications for this visit.    Review of Systems Review of Systems Constitutional: negative for fatigue and weight loss Respiratory: negative for cough and wheezing Cardiovascular: negative for chest pain, fatigue and palpitations Gastrointestinal: negative for abdominal pain and change in bowel habits Genitourinary:negative Integument/breast: negative for nipple discharge Musculoskeletal:negative for myalgias Neurological: negative for gait problems and tremors Behavioral/Psych: negative for  abusive relationship, depression Endocrine: negative for temperature intolerance     currently breastfeeding.  Physical Exam Physical Exam: Deferred  100% of 15 min visit spent on counseling and coordination of care.   Data Reviewed Pathology  Assessment     HGSIL with positive ECC.  Discussed management options and LEEP recommended and agreed to.  Risks, complications and benefits explained. All questions answered.  Contraception:  Breastfeeding.  Micronor Rx, and Ortho Novum 1/35 discontinued while breastfeeding.    Plan    LEEP to be scheduled.  No orders of the defined types were placed in this encounter.   Meds ordered this encounter  Medications  . norethindrone (MICRONOR,CAMILA,ERRIN) 0.35 MG tablet    Sig: Take 1 tablet (0.35 mg total) by mouth daily.    Dispense:  1 Package    Refill:  11

## 2015-09-20 ENCOUNTER — Other Ambulatory Visit: Payer: Self-pay | Admitting: Obstetrics

## 2015-09-20 ENCOUNTER — Other Ambulatory Visit: Payer: Self-pay | Admitting: *Deleted

## 2015-09-21 ENCOUNTER — Encounter (HOSPITAL_COMMUNITY): Payer: Self-pay

## 2015-09-22 ENCOUNTER — Encounter: Payer: Self-pay | Admitting: Obstetrics

## 2015-09-23 ENCOUNTER — Ambulatory Visit (HOSPITAL_COMMUNITY): Payer: Medicaid Other | Admitting: Certified Registered Nurse Anesthetist

## 2015-09-23 ENCOUNTER — Ambulatory Visit (HOSPITAL_COMMUNITY)
Admission: RE | Admit: 2015-09-23 | Discharge: 2015-09-23 | Disposition: A | Discharge status: Home / Self Care | Payer: Medicaid Other | Source: Ambulatory Visit | Attending: Obstetrics | Admitting: Obstetrics

## 2015-09-23 ENCOUNTER — Encounter (HOSPITAL_COMMUNITY)
Admission: RE | Disposition: A | Discharge status: Home / Self Care | Payer: Self-pay | Source: Ambulatory Visit | Attending: Obstetrics

## 2015-09-23 DIAGNOSIS — N871 Moderate cervical dysplasia: Secondary | ICD-10-CM | POA: Diagnosis not present

## 2015-09-23 DIAGNOSIS — R896 Abnormal cytological findings in specimens from other organs, systems and tissues: Secondary | ICD-10-CM | POA: Diagnosis not present

## 2015-09-23 DIAGNOSIS — R87613 High grade squamous intraepithelial lesion on cytologic smear of cervix (HGSIL): Secondary | ICD-10-CM | POA: Diagnosis present

## 2015-09-23 HISTORY — PX: LEEP: SHX91

## 2015-09-23 LAB — PREGNANCY, URINE: PREG TEST UR: NEGATIVE

## 2015-09-23 SURGERY — LEEP (LOOP ELECTROSURGICAL EXCISION PROCEDURE)
Anesthesia: Monitor Anesthesia Care

## 2015-09-23 MED ORDER — FERRIC SUBSULFATE 259 MG/GM EX SOLN
CUTANEOUS | Status: AC
  Filled 2015-09-23: qty 8

## 2015-09-23 MED ORDER — LIDOCAINE-EPINEPHRINE 1 %-1:100000 IJ SOLN
INTRAMUSCULAR | Status: AC
  Filled 2015-09-23: qty 1

## 2015-09-23 MED ORDER — PROPOFOL 500 MG/50ML IV EMUL
INTRAVENOUS | Status: DC | PRN
  Administered 2015-09-23: 75 ug/kg/min via INTRAVENOUS

## 2015-09-23 MED ORDER — LIDOCAINE HCL (CARDIAC) 20 MG/ML IV SOLN
INTRAVENOUS | Status: AC
  Filled 2015-09-23: qty 5

## 2015-09-23 MED ORDER — DEXAMETHASONE SODIUM PHOSPHATE 10 MG/ML IJ SOLN
INTRAMUSCULAR | Status: AC
  Filled 2015-09-23: qty 1

## 2015-09-23 MED ORDER — PROPOFOL 500 MG/50ML IV EMUL
INTRAVENOUS | Status: DC | PRN
  Administered 2015-09-23 (×2): 60 mg via INTRAVENOUS
  Administered 2015-09-23: 40 mg via INTRAVENOUS

## 2015-09-23 MED ORDER — ONDANSETRON HCL 4 MG/2ML IJ SOLN
INTRAMUSCULAR | Status: DC | PRN
  Administered 2015-09-23: 4 mg via INTRAVENOUS

## 2015-09-23 MED ORDER — SCOPOLAMINE 1 MG/3DAYS TD PT72
MEDICATED_PATCH | TRANSDERMAL | Status: AC
  Filled 2015-09-23: qty 1

## 2015-09-23 MED ORDER — OXYCODONE-ACETAMINOPHEN 5-325 MG PO TABS: 1.0000 | ORAL_TABLET | ORAL | Status: DC | PRN

## 2015-09-23 MED ORDER — MIDAZOLAM HCL 2 MG/2ML IJ SOLN
INTRAMUSCULAR | Status: DC | PRN
  Administered 2015-09-23: 2 mg via INTRAVENOUS

## 2015-09-23 MED ORDER — KETOROLAC TROMETHAMINE 30 MG/ML IJ SOLN
INTRAMUSCULAR | Status: DC | PRN
  Administered 2015-09-23: 30 mg via INTRAVENOUS

## 2015-09-23 MED ORDER — FENTANYL CITRATE (PF) 100 MCG/2ML IJ SOLN
INTRAMUSCULAR | Status: DC | PRN
  Administered 2015-09-23: 100 ug via INTRAVENOUS

## 2015-09-23 MED ORDER — ONDANSETRON HCL 4 MG/2ML IJ SOLN
INTRAMUSCULAR | Status: AC
  Filled 2015-09-23: qty 2

## 2015-09-23 MED ORDER — FENTANYL CITRATE (PF) 250 MCG/5ML IJ SOLN
INTRAMUSCULAR | Status: AC
  Filled 2015-09-23: qty 5

## 2015-09-23 MED ORDER — SCOPOLAMINE 1 MG/3DAYS TD PT72
1.0000 | MEDICATED_PATCH | Freq: Once | TRANSDERMAL | Status: DC
  Administered 2015-09-23: 1.5 mg via TRANSDERMAL

## 2015-09-23 MED ORDER — IBUPROFEN 800 MG PO TABS
800.0000 mg | ORAL_TABLET | Freq: Three times a day (TID) | ORAL | Status: DC | PRN
Start: 2015-09-23 — End: 2015-12-01

## 2015-09-23 MED ORDER — MIDAZOLAM HCL 2 MG/2ML IJ SOLN
INTRAMUSCULAR | Status: AC
  Filled 2015-09-23: qty 2

## 2015-09-23 MED ORDER — DEXAMETHASONE SODIUM PHOSPHATE 10 MG/ML IJ SOLN
INTRAMUSCULAR | Status: DC | PRN
  Administered 2015-09-23: 4 mg via INTRAVENOUS

## 2015-09-23 MED ORDER — FENTANYL CITRATE (PF) 100 MCG/2ML IJ SOLN: 25.0000 ug | INTRAMUSCULAR | Status: DC | PRN

## 2015-09-23 MED ORDER — SCOPOLAMINE 1 MG/3DAYS TD PT72
1.0000 | MEDICATED_PATCH | TRANSDERMAL | Status: DC
Start: 2015-09-23 — End: 2015-09-23

## 2015-09-23 MED ORDER — FERRIC SUBSULFATE SOLN
Status: DC | PRN
  Administered 2015-09-23: 1

## 2015-09-23 MED ORDER — LACTATED RINGERS IV SOLN
INTRAVENOUS | Status: DC
  Administered 2015-09-23 (×2): via INTRAVENOUS

## 2015-09-23 MED ORDER — DEXAMETHASONE SODIUM PHOSPHATE 4 MG/ML IJ SOLN
INTRAMUSCULAR | Status: AC
  Filled 2015-09-23: qty 1

## 2015-09-23 MED ORDER — LIDOCAINE-EPINEPHRINE 1 %-1:100000 IJ SOLN
INTRAMUSCULAR | Status: DC | PRN
  Administered 2015-09-23: 20 mL

## 2015-09-23 MED ORDER — LIDOCAINE HCL (CARDIAC) 20 MG/ML IV SOLN
INTRAVENOUS | Status: DC | PRN
  Administered 2015-09-23: 100 mg via INTRAVENOUS

## 2015-09-23 MED ORDER — PROPOFOL 10 MG/ML IV BOLUS
INTRAVENOUS | Status: AC
  Filled 2015-09-23: qty 40

## 2015-09-23 MED ORDER — IODINE STRONG (LUGOLS) 5 % PO SOLN
ORAL | Status: AC
  Filled 2015-09-23: qty 1

## 2015-09-23 SURGICAL SUPPLY — 32 items
APPLICATOR COTTON TIP 6IN STRL (MISCELLANEOUS) ×3 IMPLANT
CATH ROBINSON RED A/P 16FR (CATHETERS) IMPLANT
CLOTH BEACON ORANGE TIMEOUT ST (SAFETY) ×3 IMPLANT
DEPRESSOR TONGUE BLADE STERILE (MISCELLANEOUS) ×9 IMPLANT
ELECT BALL LEEP 5MM RED (ELECTRODE) ×3 IMPLANT
ELECT LOOP LEEP RND 15X12 GRN (CUTTING LOOP) ×3
ELECT LOOP LEEP RND 20X12 WHT (CUTTING LOOP)
ELECT REM PT RETURN 9FT ADLT (ELECTROSURGICAL) ×3
ELECTRODE LOOP LP RND 15X12GRN (CUTTING LOOP) ×1 IMPLANT
ELECTRODE LOOP LP RND 20X12WHT (CUTTING LOOP) IMPLANT
ELECTRODE REM PT RTRN 9FT ADLT (ELECTROSURGICAL) ×1 IMPLANT
EXTENDER ELECT LOOP LEEP 10CM (CUTTING LOOP) IMPLANT
GLOVE BIO SURGEON STRL SZ8 (GLOVE) ×6 IMPLANT
GLOVE BIOGEL PI IND STRL 7.0 (GLOVE) ×1 IMPLANT
GLOVE BIOGEL PI INDICATOR 7.0 (GLOVE) ×2
GOWN STRL REUS W/TWL LRG LVL3 (GOWN DISPOSABLE) ×6 IMPLANT
HOSE NS SMOKE EVAC 7/8 X6 (MISCELLANEOUS) ×2 IMPLANT
HOSE NS SMOKE EVAC 7/8 X6' (MISCELLANEOUS) ×1
NEEDLE HYPO 22GX1.5 SAFETY (NEEDLE) ×3 IMPLANT
NS IRRIG 1000ML POUR BTL (IV SOLUTION) ×3 IMPLANT
PACK VAGINAL MINOR WOMEN LF (CUSTOM PROCEDURE TRAY) ×3 IMPLANT
PAD OB MATERNITY 4.3X12.25 (PERSONAL CARE ITEMS) ×3 IMPLANT
PENCIL BUTTON HOLSTER BLD 10FT (ELECTRODE) ×3 IMPLANT
PENCIL SMOKE EVAC W/HOLSTER (ELECTROSURGICAL) ×3 IMPLANT
REDUCER FITTING SMOKE EVAC (MISCELLANEOUS) IMPLANT
SCOPETTES 8  STERILE (MISCELLANEOUS) ×4
SCOPETTES 8 STERILE (MISCELLANEOUS) ×2 IMPLANT
SUT VIC AB 0 CT1 27 (SUTURE) ×2
SUT VIC AB 0 CT1 27XBRD ANBCTR (SUTURE) ×1 IMPLANT
TOWEL OR 17X24 6PK STRL BLUE (TOWEL DISPOSABLE) ×6 IMPLANT
TUBING SMOKE EVAC HOSE ADAPTER (MISCELLANEOUS) IMPLANT
WATER STERILE IRR 1000ML POUR (IV SOLUTION) ×3 IMPLANT

## 2015-09-23 NOTE — H&P (Signed)
Stephanie [name] is an 25 y.o. female. HGSIL.  For LEEP.  Pertinent Gynecological History: Menses: flow is moderate Bleeding: normal Contraception: OCP (estrogen/progesterone) DES exposure: denies Blood transfusions: none Sexually transmitted diseases: currently at risk Previous GYN Procedures: none  Last mammogram: n/a Date: n/a Last pap: abnormal: 2016 Date: 2016 OB History: G4, P4202   Menstrual History: Menarche age: [age]  Patient's last menstrual period was 08/26/2015 (exact date).    Past Medical History  Diagnosis Date  . UTI (urinary tract infection)   . Hypertension     with pregnancy    Past Surgical History  Procedure Laterality Date  . No past surgeries    . Dental surgery      Family History  Problem Relation Age of Onset  . Diabetes Father   . Hypertension Father   . Hyperlipidemia Father     Social History:  reports that she has never smoked. She has never used smokeless tobacco. She reports that she does not drink alcohol or use illicit drugs.  Allergies: No Known Allergies  Prescriptions prior to admission  Medication Sig Dispense Refill Last Dose  . norethindrone (MICRONOR,CAMILA,ERRIN) 0.35 MG tablet Take 1 tablet (0.35 mg total) by mouth daily. 1 Package 11 09/22/2015 at Unknown time  . acetaminophen (TYLENOL) 325 MG tablet Take 1 tablet (325 mg total) by mouth every 6 (six) hours as needed for mild pain or headache. (Patient not taking: Reported on 06/21/2015) 30 tablet 0 Not Taking  . ibuprofen (ADVIL,MOTRIN) 600 MG tablet Take 1 tablet (600 mg total) by mouth every 6 (six) hours as needed for mild pain. (Patient not taking: Reported on 06/01/2015) 30 tablet 5 Not Taking  . ranitidine (ZANTAC) 150 MG tablet Take 1 tablet (150 mg total) by mouth 2 (two) times daily. (Patient not taking: Reported on 06/01/2015) 60 tablet 5 Not Taking    Review of Systems  All other systems reviewed and are negative.   Blood pressure 127/69, temperature 98.1 F  (36.7 C), temperature source Oral, resp. rate 20, height 5\' 3"  (1.6 m), weight 144 lb (65.318 kg), last menstrual period 08/26/2015, currently breastfeeding. Physical Exam  Nursing note and vitals reviewed. Constitutional: She is oriented to person, place, and time. She appears well-developed and well-nourished.  HENT:  Head: Normocephalic and atraumatic.  Eyes: Conjunctivae are normal. Pupils are equal, round, and reactive to light.  Neck: Normal range of motion. Neck supple.  Cardiovascular: Normal rate and regular rhythm.   Respiratory: Effort normal and breath sounds normal.  GI: Soft. Bowel sounds are normal.  Genitourinary: Vagina normal and uterus normal.  Musculoskeletal: Normal range of motion.  Neurological: She is alert and oriented to person, place, and time.  Skin: Skin is warm and dry.  Psychiatric: She has a normal mood and affect. Her behavior is normal. Judgment and thought content normal.    Results for orders placed or performed during the hospital encounter of 09/23/15 (from the past 24 hour(s))  Pregnancy, urine     Status: None   Collection Time: 09/23/15 12:09 PM  Result Value Ref Range   Preg Test, Ur NEGATIVE NEGATIVE    No results found.  Assessment/Plan: HGSIL.  For LEEP.  Stephanie [name] A 09/23/2015, 12:37 PM

## 2015-09-23 NOTE — Anesthesia Preprocedure Evaluation (Signed)
Anesthesia Evaluation  Patient identified by MRN, date of birth, ID band Patient awake    Reviewed: Allergy & Precautions, H&P , Patient's Chart, lab work & pertinent test results, reviewed documented beta blocker date and time   Airway Mallampati: II  TM Distance: >3 FB Neck ROM: full    Dental no notable dental hx.    Pulmonary    Pulmonary exam normal breath sounds clear to auscultation       Cardiovascular hypertension,  Rhythm:regular Rate:Normal     Neuro/Psych    GI/Hepatic   Endo/Other    Renal/GU      Musculoskeletal   Abdominal   Peds  Hematology   Anesthesia Other Findings   Reproductive/Obstetrics                             Anesthesia Physical Anesthesia Plan  ASA: II  Anesthesia Plan: MAC   Post-op Pain Management:    Induction: Intravenous  Airway Management Planned: Mask and Natural Airway  Additional Equipment:   Intra-op Plan:   Post-operative Plan:   Informed Consent: I have reviewed the patients History and Physical, chart, labs and discussed the procedure including the risks, benefits and alternatives for the proposed anesthesia with the patient or authorized representative who has indicated his/her understanding and acceptance.   Dental Advisory Given  Plan Discussed with: CRNA and Surgeon  Anesthesia Plan Comments: (Discussed sedation and potential to need to place airway or ETT if warranted by clinical changes intra-operatively. We will start procedure as MAC.)        Anesthesia Quick Evaluation  

## 2015-09-23 NOTE — Discharge Instructions (Signed)

## 2015-09-23 NOTE — Anesthesia Postprocedure Evaluation (Signed)
Anesthesia Post Note  Patient: Stephanie [name]  Procedure(s) Performed: Procedure(s) (LRB): LOOP ELECTROSURGICAL EXCISION PROCEDURE (LEEP) (N/A)  Patient location during evaluation: PACU Anesthesia Type: MAC Level of consciousness: awake and alert Pain management: pain level controlled Vital Signs Assessment: post-procedure vital signs reviewed and stable Respiratory status: spontaneous breathing, nonlabored ventilation, respiratory function stable and patient connected to nasal cannula oxygen Cardiovascular status: stable and blood pressure returned to baseline Anesthetic complications: no    Last Vitals:  Filed Vitals:   09/23/15 1530 09/23/15 1605  BP: 115/67 124/70  Pulse: 69 66  Temp: 37.1 C 37.1 C  Resp: 15 16    Last Pain: There were no vitals filed for this visit.               Jiles GarterJACKSON,Aarianna Hoadley EDWARD

## 2015-09-23 NOTE — Transfer of Care (Signed)
Immediate Anesthesia Transfer of Care Note  Patient: Stephanie DoyneLaura [name]  Procedure(s) Performed: Procedure(s): LOOP ELECTROSURGICAL EXCISION PROCEDURE (LEEP) (N/A)  Patient Location: PACU  Anesthesia Type:MAC  Level of Consciousness: awake, alert  and oriented  Airway & Oxygen Therapy: Patient Spontanous Breathing and Patient connected to nasal cannula oxygen  Post-op Assessment: Report given to RN, Post -op Vital signs reviewed and stable and Patient moving all extremities  Post vital signs: Reviewed and stable  Last Vitals:  Filed Vitals:   09/23/15 1211  BP: 127/69  Temp: 36.7 C  Resp: 20    Complications: No apparent anesthesia complications

## 2015-09-24 NOTE — Op Note (Signed)
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  The patient was placed in lithotomy position.  An insulated speculum was placed within the vagina and the cervix was well visualized.  The cervix was then observed colposcopically by placement of acetic acid.  The colposcopic findings were confirmed. 2% lidocaine with dilute epinephrine was then used to infiltrated the margins of the exocervix and blanching was noted.  The loop electrode was then selected and used to excise the transformation zone including the abnormal appearing areas.  This specimen was then sent for pathologic evaluation.  The bed of the excision site was then fulgurated with the ball electrode and Monsel's solution was applied.  INSTRUCTIONS:  The speculum was then removed and the patient instructed to perform light activities for the rest of the day.  She is to insert nothing in the vagina during this time period.  She was also instructed to avoid intercourse for 4 weeks.  Pre-printed instructions were given to the patient. 

## 2015-09-26 ENCOUNTER — Encounter (HOSPITAL_COMMUNITY): Payer: Self-pay | Admitting: Obstetrics

## 2015-10-07 ENCOUNTER — Telehealth: Payer: Self-pay | Admitting: *Deleted

## 2015-10-07 NOTE — Telephone Encounter (Signed)
Per [name], CNM  Patient may have her Stephanie BouchardKyleena IUD insertion two menstrual cycles after her LEEP which was on 09-23-15.

## 2015-10-10 ENCOUNTER — Ambulatory Visit (INDEPENDENT_AMBULATORY_CARE_PROVIDER_SITE_OTHER): Payer: Medicaid Other | Admitting: Obstetrics

## 2015-10-10 ENCOUNTER — Encounter: Payer: Self-pay | Admitting: Obstetrics

## 2015-10-10 VITALS — BP 120/74 | HR 74 | Temp 98.7°F | Wt 146.0 lb

## 2015-10-10 DIAGNOSIS — Z9889 Other specified postprocedural states: Secondary | ICD-10-CM

## 2015-10-10 DIAGNOSIS — R896 Abnormal cytological findings in specimens from other organs, systems and tissues: Secondary | ICD-10-CM

## 2015-10-10 DIAGNOSIS — IMO0002 Reserved for concepts with insufficient information to code with codable children: Secondary | ICD-10-CM

## 2015-10-10 NOTE — Progress Notes (Signed)
Subjective:     Stephanie [name] is a 25 y.o. female who presents to the clinic 2 weeks status post LEEP for HGSIL. Eating a regular diet without difficulty. Bowel movements are normal. The patient is not having any pain.  The following portions of the patient's history were reviewed and updated as appropriate: allergies, current medications, past family history, past medical history, past social history, past surgical history and problem list.  Review of Systems A comprehensive review of systems was negative.    Objective:    BP 120/74 mmHg  Pulse 74  Temp(Src) 98.7 F (37.1 C)  Wt 146 lb (66.225 kg)  LMP 09/18/2015 (Exact Date) General:  alert and no distress  Abdomen: soft, bowel sounds active, non-tender  Cervical Incision:    SSE of cervical LEEP bed clean, no active bleeding or abnl. discharge       Assessment:    Doing well postoperatively. Operative findings again reviewed. Pathology report discussed.  Margins clear.   Plan:    1. Continue any current medications. 2. Wound care discussed. 3. Activity restrictions: No sex for 6 weeks 4. 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 5. Follow up: 6 weeks.

## 2015-11-21 ENCOUNTER — Ambulatory Visit (INDEPENDENT_AMBULATORY_CARE_PROVIDER_SITE_OTHER): Payer: Medicaid Other | Admitting: Obstetrics

## 2015-12-01 ENCOUNTER — Encounter: Payer: Self-pay | Admitting: Obstetrics

## 2015-12-01 ENCOUNTER — Ambulatory Visit (INDEPENDENT_AMBULATORY_CARE_PROVIDER_SITE_OTHER): Payer: Medicaid Other | Admitting: Obstetrics

## 2015-12-01 VITALS — BP 118/74 | HR 86 | Wt 148.0 lb

## 2015-12-01 DIAGNOSIS — IMO0002 Reserved for concepts with insufficient information to code with codable children: Secondary | ICD-10-CM

## 2015-12-01 DIAGNOSIS — R896 Abnormal cytological findings in specimens from other organs, systems and tissues: Secondary | ICD-10-CM

## 2015-12-01 DIAGNOSIS — Z9889 Other specified postprocedural states: Secondary | ICD-10-CM

## 2015-12-05 ENCOUNTER — Encounter: Payer: Self-pay | Admitting: Obstetrics

## 2015-12-05 NOTE — Progress Notes (Signed)
Patient ID: Stephanie DoyneLaura Cleckley, female   DOB: 12-29-1990, 25 y.o.   MRN: [id number]  Chief Complaint  Patient presents with  . Follow-up    post op LEEP  . Menstrual Problem    HPI Stephanie DoyneLaura Hermida is a 25 y.o. female.  HGSIL.  S/P LEEP.  No complaints.  HPI  Past Medical History  Diagnosis Date  . UTI (urinary tract infection)   . Hypertension     with pregnancy    Past Surgical History  Procedure Laterality Date  . No past surgeries    . Dental surgery    . Leep N/A 09/23/2015    Procedure: LOOP ELECTROSURGICAL EXCISION PROCEDURE (LEEP);  Surgeon: Brock Badharles A Harper, MD;  Location: [facility];  Service: Gynecology;  Laterality: N/A;    Family History  Problem Relation Age of Onset  . Diabetes Father   . Hypertension Father   . Hyperlipidemia Father     Social History Social History  Substance Use Topics  . Smoking status: Never Smoker   . Smokeless tobacco: Never Used  . Alcohol Use: No    No Known Allergies  Current Outpatient Prescriptions  Medication Sig Dispense Refill  . norethindrone-ethinyl estradiol 1/35 (ORTHO-NOVUM, NORTREL,CYCLAFEM) tablet Take 1 tablet by mouth daily.     No current facility-administered medications for this visit.    Review of Systems Review of Systems Constitutional: negative for fatigue and weight loss Respiratory: negative for cough and wheezing Cardiovascular: negative for chest pain, fatigue and palpitations Gastrointestinal: negative for abdominal pain and change in bowel habits Genitourinary:negative Integument/breast: negative for nipple discharge Musculoskeletal:negative for myalgias Neurological: negative for gait problems and tremors Behavioral/Psych: negative for abusive relationship, depression Endocrine: negative for temperature intolerance     Blood pressure 118/74, pulse 86, weight 148 lb (67.132 kg), last menstrual period 11/25/2015, currently breastfeeding.  Physical Exam Physical Exam            General:  Alert and  no distress Abdomen:  normal findings: no organomegaly, soft, non-tender and no hernia  Pelvis:  External genitalia: normal general appearance Urinary system: urethral meatus normal and bladder without fullness, nontender Vaginal: normal without tenderness, induration or masses Cervix: normal appearance Adnexa: normal bimanual exam Uterus: anteverted and non-tender, normal size     Data Reviewed Pathology  Assessment     HGSIL.  S/P LEEP.  Doing well.     Plan    Repeat pap q  4 months, per Dysplasia Protocol.   No orders of the defined types were placed in this encounter.   No orders of the defined types were placed in this encounter.

## 2016-01-31 ENCOUNTER — Ambulatory Visit: Payer: Medicaid Other | Admitting: Obstetrics

## 2016-02-09 ENCOUNTER — Ambulatory Visit: Payer: Medicaid Other | Admitting: Obstetrics

## 2016-02-21 ENCOUNTER — Encounter: Payer: Self-pay | Admitting: Obstetrics

## 2016-02-21 ENCOUNTER — Ambulatory Visit (INDEPENDENT_AMBULATORY_CARE_PROVIDER_SITE_OTHER): Payer: Medicaid Other | Admitting: Obstetrics

## 2016-02-21 VITALS — BP 132/85 | HR 79 | Wt 149.0 lb

## 2016-02-21 DIAGNOSIS — R896 Abnormal cytological findings in specimens from other organs, systems and tissues: Secondary | ICD-10-CM | POA: Diagnosis not present

## 2016-02-21 DIAGNOSIS — IMO0002 Reserved for concepts with insufficient information to code with codable children: Secondary | ICD-10-CM

## 2016-02-21 DIAGNOSIS — Z9889 Other specified postprocedural states: Secondary | ICD-10-CM

## 2016-02-21 NOTE — Addendum Note (Signed)
Addended by: Marya LandryFOSTER, Ramyah Pankowski D on: 02/21/2016 04:08 PM   Modules accepted: Orders

## 2016-02-21 NOTE — Progress Notes (Signed)
Patient ID: Stephanie [name], female   DOB: 1990/10/30, 25 y.o.   MRN: [id number]  Chief Complaint  Patient presents with  . Follow-up    pt here today for repeat pap after LEEP procedure in March.  pt states she was seen at [facility] on Friday for prolong bleeding. pt states her LMP was beginning of the month and has continued to have bleeding for 3 weeks. pt states she was given Provera and Tramadol. pt is currently taking and has stopped her BC as advised.    HPI Stephanie DoyneLaura Russon is a 25 y.o. female.  H/O ASC-H and positive HPV.  S/P LEEP.  Presents for 1st pap after LEEP.  Had episode of AUB this month.  Resolved with Provera. HPI  Past Medical History:  Diagnosis Date  . Hypertension    with pregnancy  . UTI (urinary tract infection)     Past Surgical History:  Procedure Laterality Date  . DENTAL SURGERY    . LEEP N/A 09/23/2015   Procedure: LOOP ELECTROSURGICAL EXCISION PROCEDURE (LEEP);  Surgeon: Brock Badharles A Kristin Lamagna, MD;  Location: [facility];  Service: Gynecology;  Laterality: N/A;  . NO PAST SURGERIES      Family History  Problem Relation Age of Onset  . Diabetes Father   . Hypertension Father   . Hyperlipidemia Father     Social History Social History  Substance Use Topics  . Smoking status: Never Smoker  . Smokeless tobacco: Never Used  . Alcohol use No    No Known Allergies  Current Outpatient Prescriptions  Medication Sig Dispense Refill  . medroxyPROGESTERone (PROVERA) 10 MG tablet Take 10 mg by mouth daily.    . medroxyPROGESTERone (PROVERA) 10 MG tablet Take 10 mg by mouth.    . naproxen (NAPROSYN) 500 MG tablet Take 500 mg by mouth.    . traMADol (ULTRAM) 50 MG tablet Take by mouth every 6 (six) hours as needed.    . norethindrone-ethinyl estradiol 1/35 (ORTHO-NOVUM, NORTREL,CYCLAFEM) tablet Take 1 tablet by mouth daily.     No current facility-administered medications for this visit.     Review of Systems Review of Systems Constitutional: negative for fatigue and  weight loss Respiratory: negative for cough and wheezing Cardiovascular: negative for chest pain, fatigue and palpitations Gastrointestinal: negative for abdominal pain and change in bowel habits Genitourinary:positive for AUB Integument/breast: negative for nipple discharge Musculoskeletal:negative for myalgias Neurological: negative for gait problems and tremors Behavioral/Psych: negative for abusive relationship, depression Endocrine: negative for temperature intolerance     Blood pressure 132/85, pulse 79, weight 149 lb (67.6 kg), last menstrual period 02/07/2016, currently breastfeeding.  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 S/P LEEP H/O AUB, resolved.    Plan    Continue pap q 4 months.  Resume OCP's F/U 4 months for Pap and Annual  No orders of the defined types were placed in this encounter.  Meds ordered this encounter  Medications  . medroxyPROGESTERone (PROVERA) 10 MG tablet  Sig: Take 10 mg by mouth daily.  . traMADol (ULTRAM) 50 MG tablet    Sig: Take by mouth every 6 (six) hours as needed.  . naproxen (NAPROSYN) 500 MG tablet    Sig: Take 500 mg by mouth.  . medroxyPROGESTERone (PROVERA) 10 MG tablet    Sig: Take 10 mg by mouth.

## 2016-02-23 LAB — PAP IG AND HPV HIGH-RISK
HPV, HIGH-RISK: NEGATIVE
PAP Smear Comment: 0

## 2016-02-27 LAB — NUSWAB VG+, CANDIDA 6SP
CANDIDA GLABRATA, NAA: NEGATIVE
CANDIDA KRUSEI, NAA: NEGATIVE
CANDIDA LUSITANIAE, NAA: POSITIVE — AB
CANDIDA PARAPSILOSIS, NAA: NEGATIVE
CANDIDA TROPICALIS, NAA: NEGATIVE
Candida albicans, NAA: POSITIVE — AB
Chlamydia trachomatis, NAA: NEGATIVE
Neisseria gonorrhoeae, NAA: NEGATIVE
TRICH VAG BY NAA: NEGATIVE

## 2016-02-29 ENCOUNTER — Other Ambulatory Visit: Payer: Self-pay | Admitting: Obstetrics

## 2016-02-29 ENCOUNTER — Telehealth: Payer: Self-pay | Admitting: *Deleted

## 2016-02-29 DIAGNOSIS — B373 Candidiasis of vulva and vagina: Secondary | ICD-10-CM

## 2016-02-29 DIAGNOSIS — B3731 Acute candidiasis of vulva and vagina: Secondary | ICD-10-CM

## 2016-02-29 MED ORDER — FLUCONAZOLE 150 MG PO TABS: 150.0000 mg | ORAL_TABLET | Freq: Once | ORAL | 2 refills | Status: AC

## 2016-02-29 NOTE — Telephone Encounter (Signed)
Patient aware of lab result and medication called to pharmacy.

## 2016-04-01 IMAGING — CT CT HEAD W/O CM
2 series · 16 of 30 positions shown, 20 images · non-contrast
Comparison: None.

CLINICAL DATA: One day history of headache

EXAM:
CT HEAD WITHOUT CONTRAST
TECHNIQUE: Contiguous axial images were obtained from the base of the skull
through the vertex without intravenous contrast.

[Series 201: head w/o, idose (1) · axial · non-contrast · 0.41mm/px · z∈[+96,+226]mm · 13 of 32 slices shown, 17 images]
[im 3/32  brain]
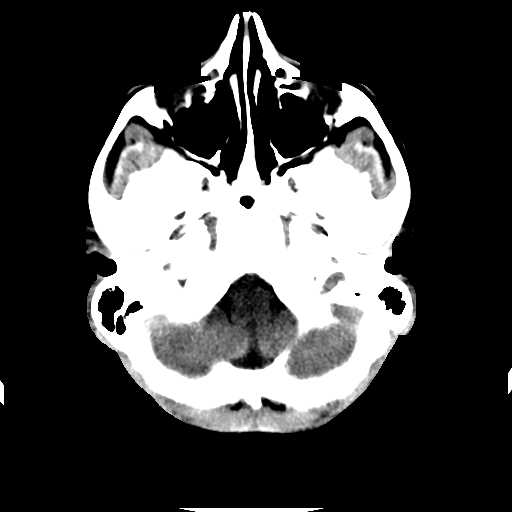
[im 3/32  bone]
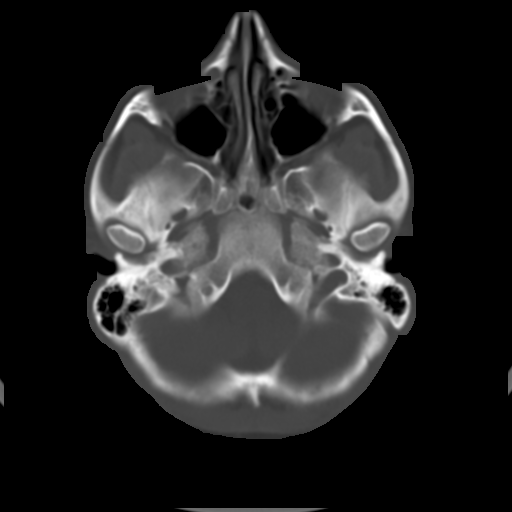
[im 5/32  brain]
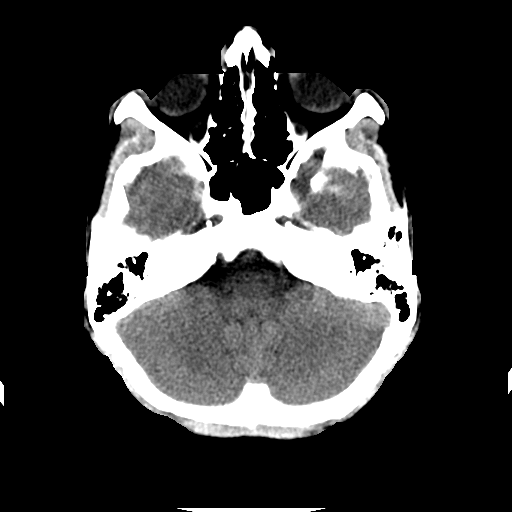
[im 7/32  brain]
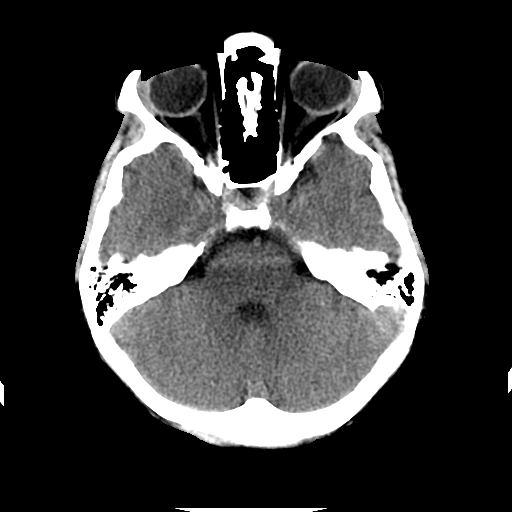
[im 9/32  brain]
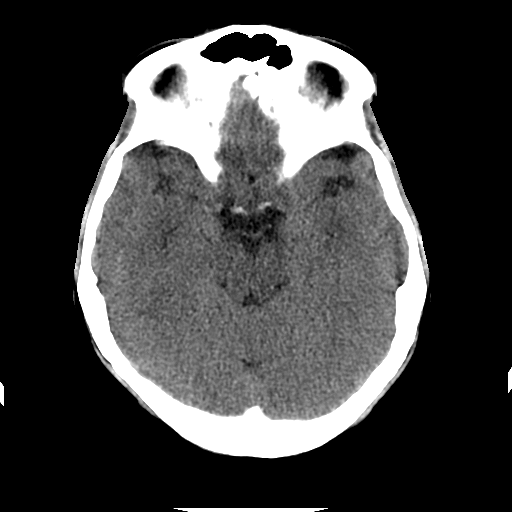
[im 12/32  brain]
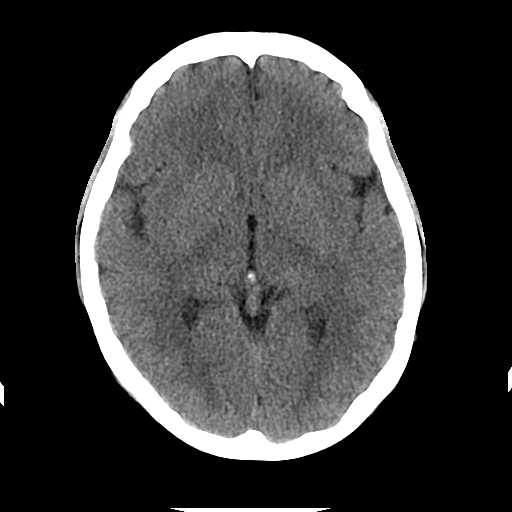
[im 12/32  bone]
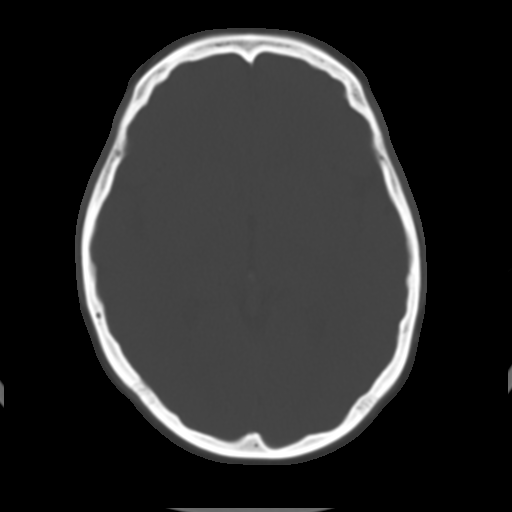
[im 14/32  brain]
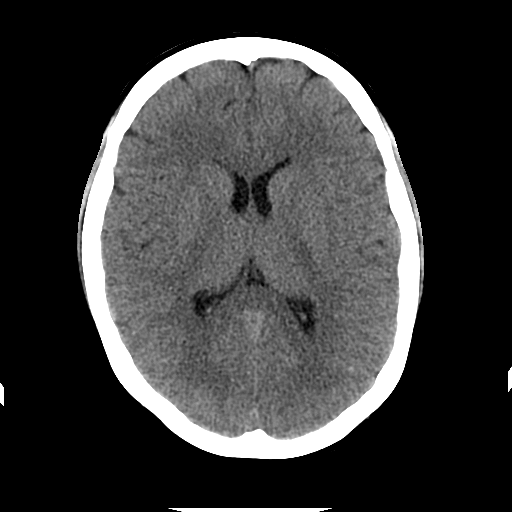
[im 16/32  brain]
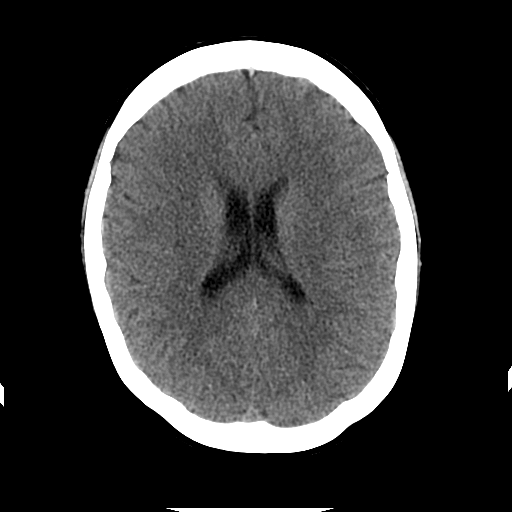
[im 18/32  brain]
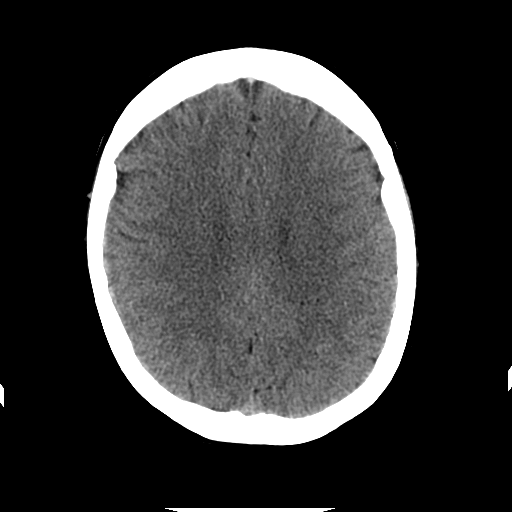
[im 20/32  brain]
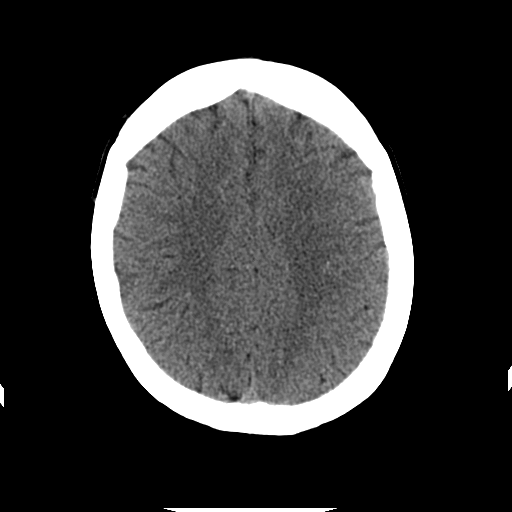
[im 20/32  bone]
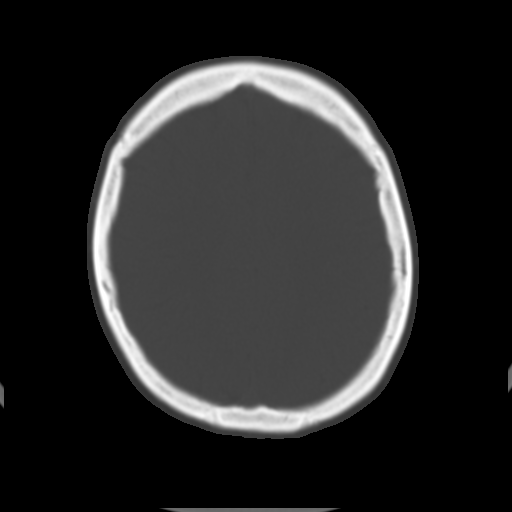
[im 23/32  brain]
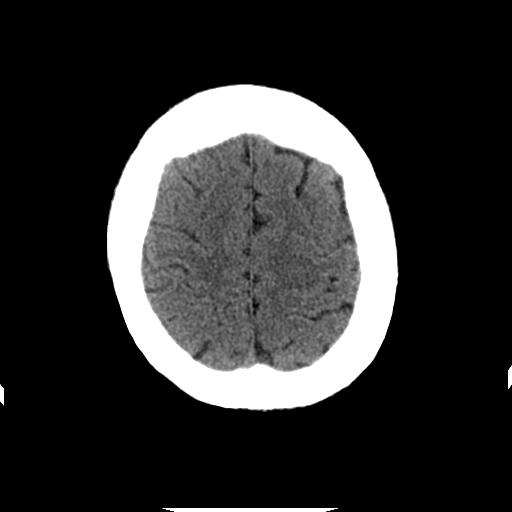
[im 25/32  brain]
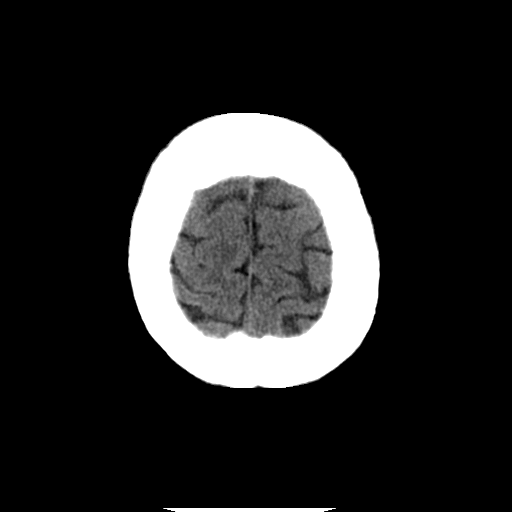
[im 27/32  brain]
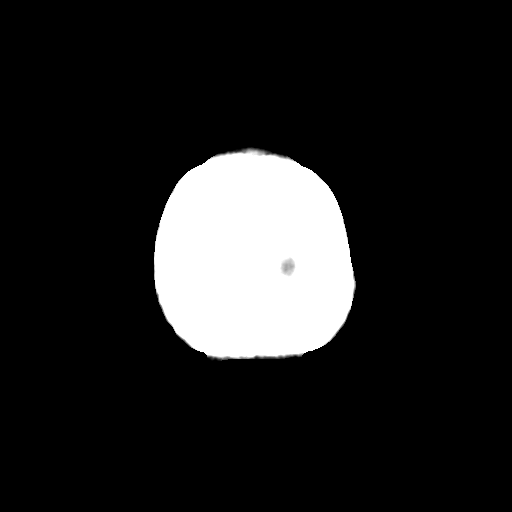
[im 29/32  brain]
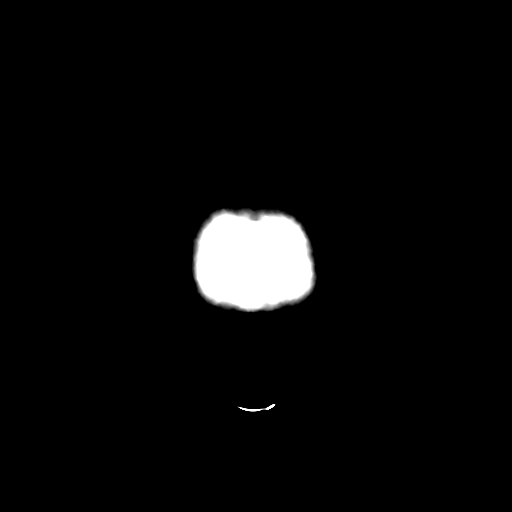
[im 29/32  bone]
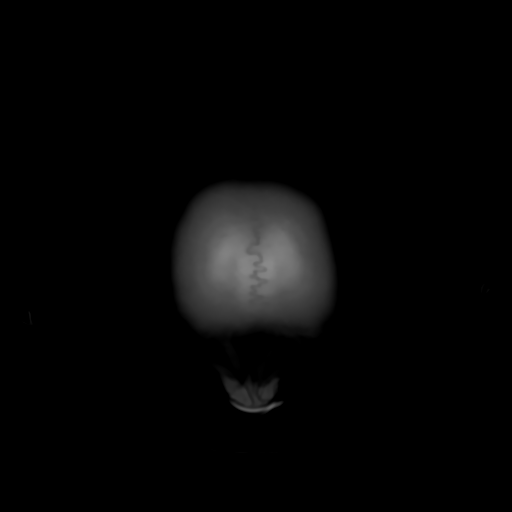

[Series 202: head w/o bone, idose (1) · axial · non-contrast · 0.41mm/px · z∈[+96,+141]mm · 3 of 32 slices shown]
[im 3/32  bone]
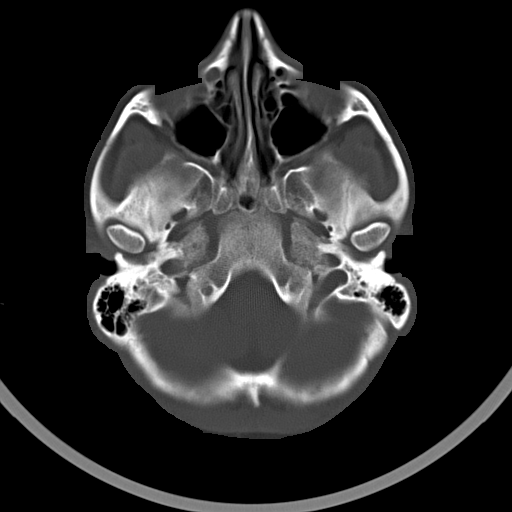
[im 7/32  bone]
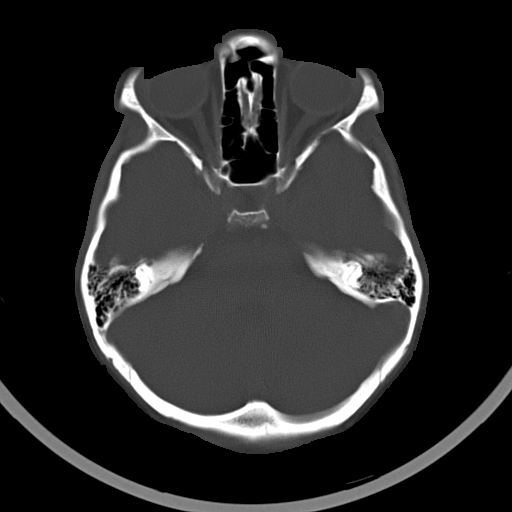
[im 12/32  bone]
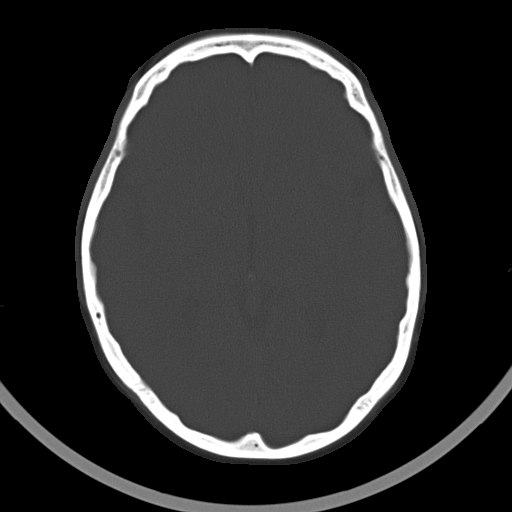

[16 of 30 positions shown; findings below may reference images not displayed]

FINDINGS: The ventricles are normal in size and configuration. There is no
intracranial mass, hemorrhage, extra-axial fluid collection, or
midline shift. The gray-white compartments are normal. No acute
infarct evident. The bony calvarium appears intact. The mastoid air
cells are clear.
IMPRESSION: Study within normal limits.

## 2016-06-25 ENCOUNTER — Ambulatory Visit: Payer: Self-pay | Admitting: Obstetrics

## 2016-07-19 ENCOUNTER — Telehealth: Payer: Self-pay | Admitting: *Deleted

## 2016-07-19 NOTE — Telephone Encounter (Signed)
[facility] Attempt to contact pt to verify Rx. LM on VM to call office.

## 2016-08-08 NOTE — Telephone Encounter (Signed)
No return call from pt. Call to be closed.
